# Patient Record
Sex: Female | Born: 1985 | Hispanic: Yes | Marital: Single | Smoking: Never smoker
Health system: Southern US, Community
[De-identification: ages and names within clinical notes are randomized; demographics above are authoritative.]

## PROBLEM LIST (undated history)

## (undated) ENCOUNTER — Inpatient Hospital Stay (HOSPITAL_COMMUNITY): Payer: Self-pay

## (undated) DIAGNOSIS — N390 Urinary tract infection, site not specified: Secondary | ICD-10-CM

## (undated) DIAGNOSIS — N83209 Unspecified ovarian cyst, unspecified side: Secondary | ICD-10-CM

## (undated) DIAGNOSIS — L309 Dermatitis, unspecified: Secondary | ICD-10-CM

## (undated) DIAGNOSIS — O24419 Gestational diabetes mellitus in pregnancy, unspecified control: Secondary | ICD-10-CM

## (undated) DIAGNOSIS — R519 Headache, unspecified: Secondary | ICD-10-CM

## (undated) DIAGNOSIS — Z789 Other specified health status: Secondary | ICD-10-CM

## (undated) HISTORY — DX: Headache, unspecified: R51.9

## (undated) HISTORY — PX: TONSILLECTOMY: SUR1361

## (undated) HISTORY — PX: WISDOM TOOTH EXTRACTION: SHX21

---
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
## 2008-03-20 ENCOUNTER — Ambulatory Visit (HOSPITAL_COMMUNITY): Admission: RE | Admit: 2008-03-20 | Discharge: 2008-03-20 | Payer: Self-pay | Admitting: Family Medicine

## 2008-04-10 ENCOUNTER — Ambulatory Visit (HOSPITAL_COMMUNITY): Admission: RE | Admit: 2008-04-10 | Discharge: 2008-04-10 | Payer: Self-pay | Admitting: Family Medicine

## 2008-04-24 ENCOUNTER — Ambulatory Visit (HOSPITAL_COMMUNITY): Admission: RE | Admit: 2008-04-24 | Discharge: 2008-04-24 | Payer: Self-pay | Admitting: Obstetrics & Gynecology

## 2008-07-01 ENCOUNTER — Ambulatory Visit (HOSPITAL_COMMUNITY): Admission: RE | Admit: 2008-07-01 | Discharge: 2008-07-01 | Payer: Self-pay | Admitting: Obstetrics & Gynecology

## 2008-09-27 ENCOUNTER — Ambulatory Visit: Payer: Self-pay | Admitting: Advanced Practice Midwife

## 2008-09-27 ENCOUNTER — Inpatient Hospital Stay (HOSPITAL_COMMUNITY): Admission: AD | Admit: 2008-09-27 | Discharge: 2008-09-30 | Payer: Self-pay | Admitting: Obstetrics & Gynecology

## 2009-05-15 ENCOUNTER — Emergency Department (HOSPITAL_COMMUNITY): Admission: EM | Admit: 2009-05-15 | Discharge: 2009-05-15 | Payer: Self-pay | Admitting: Emergency Medicine

## 2009-12-20 ENCOUNTER — Inpatient Hospital Stay (HOSPITAL_COMMUNITY): Admission: AD | Admit: 2009-12-20 | Discharge: 2009-12-20 | Payer: Self-pay | Admitting: Obstetrics & Gynecology

## 2010-01-16 ENCOUNTER — Ambulatory Visit (HOSPITAL_COMMUNITY): Admission: RE | Admit: 2010-01-16 | Discharge: 2010-01-16 | Payer: Self-pay | Admitting: Family Medicine

## 2010-01-23 ENCOUNTER — Ambulatory Visit: Payer: Self-pay | Admitting: Obstetrics & Gynecology

## 2010-07-23 LAB — POCT PREGNANCY, URINE: Preg Test, Ur: NEGATIVE

## 2010-07-24 LAB — URINALYSIS, ROUTINE W REFLEX MICROSCOPIC
Bilirubin Urine: NEGATIVE
Protein, ur: NEGATIVE mg/dL
Specific Gravity, Urine: >1.03 — ABNORMAL HIGH (ref 1.005–1.030)
pH: 5.5 (ref 5.0–8.0)

## 2010-07-24 LAB — POCT PREGNANCY, URINE: Preg Test, Ur: NEGATIVE

## 2010-07-26 LAB — URINE MICROSCOPIC-ADD ON

## 2010-07-26 LAB — URINALYSIS, ROUTINE W REFLEX MICROSCOPIC
Bilirubin Urine: NEGATIVE
Nitrite: NEGATIVE
Specific Gravity, Urine: 1.017 (ref 1.005–1.030)
pH: 6.5 (ref 5.0–8.0)

## 2010-07-26 LAB — WET PREP, GENITAL: Trich, Wet Prep: NONE SEEN

## 2010-07-26 LAB — GC/CHLAMYDIA PROBE AMP, GENITAL: GC Probe Amp, Genital: NEGATIVE

## 2010-08-18 LAB — CBC
HCT: 30.2 % — ABNORMAL LOW (ref 36.0–46.0)
HCT: 37.3 % (ref 36.0–46.0)
Hemoglobin: 10.5 g/dL — ABNORMAL LOW (ref 12.0–15.0)
Hemoglobin: 13 g/dL (ref 12.0–15.0)
MCHC: 34.9 g/dL (ref 30.0–36.0)
MCV: 86.9 fL (ref 78.0–100.0)
MCV: 87.9 fL (ref 78.0–100.0)
RBC: 3.43 MIL/uL — ABNORMAL LOW (ref 3.87–5.11)
RBC: 4.29 MIL/uL (ref 3.87–5.11)
WBC: 9.9 10*3/uL (ref 4.0–10.5)

## 2015-05-11 NOTE — L&D Delivery Note (Signed)
Patient is 30 y.o. G2P1001 8556w5d admitted for SOL.   Delivery Note At 9:58 AM a viable female was delivered via Vaginal, Spontaneous Delivery (Presentation: ROA ).  APGAR: 8, 9; weight pending .   Placenta status: intact .  Cord: 3 vessel. Without complications  Anesthesia:  none Episiotomy: None Lacerations: None Suture Repair: none Est. Blood Loss (mL): 100  Mom to postpartum.  Baby to Couplet care / Skin to Skin.  Leland HerElsia J Yoo 01/23/2016, 10:14 AM  Patient is a G2P1001 at 9256w5d who was admitted in spont active labor, uncomplicated prenatal course.  She progressed without augmentation via to SVD.  I was gloved and present for delivery in its entirety.  Second stage of labor progressed, baby delivered after approx 20 mins of pushing.  Mild decels during second stage noted.  Complications: infant w/ slight turtling; pt turned to right lat Sims and post shoulder released without difficulty  Lacerations: none  EBL: 100cc  Victory Dresden, CNM 1:00 PM  01/23/2016         Upon arrival patient was complete and pushing. She pushed with good maternal effort to deliver a healthy baby girl. Baby delivered without difficulty, was noted to have good tone and place on maternal abdomen for oral suctioning, drying and stimulation. Delayed cord clamping performed. Placenta delivered intact with 3V cord. Vaginal canal and perineum was inspected and no repaired deemed necessary; hemostatic. Pitocin was started and uterus massaged until bleeding slowed. Counts of sharps, instruments, and lap pads were all correct.   Leland HerElsia J Yoo, DO PGY-1 9/15/201710:14 AM

## 2015-07-10 ENCOUNTER — Telehealth: Payer: Self-pay | Admitting: *Deleted

## 2015-07-10 ENCOUNTER — Other Ambulatory Visit: Payer: Self-pay | Admitting: Obstetrics

## 2015-07-10 DIAGNOSIS — J111 Influenza due to unidentified influenza virus with other respiratory manifestations: Secondary | ICD-10-CM

## 2015-07-10 MED ORDER — OSELTAMIVIR PHOSPHATE 75 MG PO CAPS: 75.0000 mg | ORAL_CAPSULE | Freq: Two times a day (BID) | ORAL | Status: DC

## 2015-07-10 NOTE — Telephone Encounter (Signed)
Patient is sick with flu symptoms since Monday. She is [redacted] weeks pregnant. Is there anything she can do? Advised to push fluids, get rest, and will speak to provider to see if she is a candidate for Tamiflu.

## 2015-07-10 NOTE — Telephone Encounter (Signed)
Tamiflu Rx 

## 2015-07-11 ENCOUNTER — Encounter (HOSPITAL_COMMUNITY): Payer: Self-pay | Admitting: *Deleted

## 2015-07-11 ENCOUNTER — Inpatient Hospital Stay (HOSPITAL_COMMUNITY)
Admission: AD | Admit: 2015-07-11 | Discharge: 2015-07-11 | Disposition: A | Discharge status: Home / Self Care | Payer: Medicaid Other | Source: Ambulatory Visit | Attending: Obstetrics and Gynecology | Admitting: Obstetrics and Gynecology

## 2015-07-11 DIAGNOSIS — Z3A17 17 weeks gestation of pregnancy: Secondary | ICD-10-CM | POA: Diagnosis not present

## 2015-07-11 DIAGNOSIS — J069 Acute upper respiratory infection, unspecified: Secondary | ICD-10-CM | POA: Diagnosis not present

## 2015-07-11 DIAGNOSIS — O26892 Other specified pregnancy related conditions, second trimester: Secondary | ICD-10-CM | POA: Diagnosis not present

## 2015-07-11 DIAGNOSIS — R112 Nausea with vomiting, unspecified: Secondary | ICD-10-CM | POA: Diagnosis present

## 2015-07-11 DIAGNOSIS — R05 Cough: Secondary | ICD-10-CM | POA: Diagnosis present

## 2015-07-11 DIAGNOSIS — O9989 Other specified diseases and conditions complicating pregnancy, childbirth and the puerperium: Secondary | ICD-10-CM

## 2015-07-11 DIAGNOSIS — R509 Fever, unspecified: Secondary | ICD-10-CM | POA: Diagnosis present

## 2015-07-11 HISTORY — DX: Other specified health status: Z78.9

## 2015-07-11 LAB — URINALYSIS, ROUTINE W REFLEX MICROSCOPIC
Bilirubin Urine: NEGATIVE
Glucose, UA: NEGATIVE mg/dL
Hgb urine dipstick: NEGATIVE
Ketones, ur: NEGATIVE mg/dL
LEUKOCYTES UA: NEGATIVE
NITRITE: NEGATIVE
PH: 6 (ref 5.0–8.0)
Protein, ur: NEGATIVE mg/dL

## 2015-07-11 MED ORDER — PROMETHAZINE HCL 25 MG PO TABS: 25.0000 mg | ORAL_TABLET | Freq: Four times a day (QID) | ORAL | Status: DC | PRN

## 2015-07-11 MED ORDER — BENZONATATE 100 MG PO CAPS: 200.0000 mg | ORAL_CAPSULE | Freq: Three times a day (TID) | ORAL | Status: DC | PRN

## 2015-07-11 NOTE — Progress Notes (Signed)
Written and verbal d/c instructions given by Caprice Renshawebra Callaway RN and pt voiced understanding.

## 2015-07-11 NOTE — Discharge Instructions (Signed)
Upper Respiratory Infection, Adult Most upper respiratory infections (URIs) are a viral infection of the air passages leading to the lungs. A URI affects the nose, throat, and upper air passages. The most common type of URI is nasopharyngitis and is typically referred to as "the common cold." URIs run their course and usually go away on their own. Most of the time, a URI does not require medical attention, but sometimes a bacterial infection in the upper airways can follow a viral infection. This is called a secondary infection. Sinus and middle ear infections are common types of secondary upper respiratory infections. Bacterial pneumonia can also complicate a URI. A URI can worsen asthma and chronic obstructive pulmonary disease (COPD). Sometimes, these complications can require emergency medical care and may be life threatening.  CAUSES Almost all URIs are caused by viruses. A virus is a type of germ and can spread from one person to another.  RISKS FACTORS You may be at risk for a URI if:   You smoke.   You have chronic heart or lung disease.  You have a weakened defense (immune) system.   You are very young or very old.   You have nasal allergies or asthma.  You work in crowded or poorly ventilated areas.  You work in health care facilities or schools. SIGNS AND SYMPTOMS  Symptoms typically develop 2-3 days after you come in contact with a cold virus. Most viral URIs last 7-10 days. However, viral URIs from the influenza virus (flu virus) can last 14-18 days and are typically more severe. Symptoms may include:   Runny or stuffy (congested) nose.   Sneezing.   Cough.   Sore throat.   Headache.   Fatigue.   Fever.   Loss of appetite.   Pain in your forehead, behind your eyes, and over your cheekbones (sinus pain).  Muscle aches.  DIAGNOSIS  Your health care provider may diagnose a URI by:  Physical exam.  Tests to check that your symptoms are not due to  another condition such as:  Strep throat.  Sinusitis.  Pneumonia.  Asthma. TREATMENT  A URI goes away on its own with time. It cannot be cured with medicines, but medicines may be prescribed or recommended to relieve symptoms. Medicines may help:  Reduce your fever.  Reduce your cough.  Relieve nasal congestion. HOME CARE INSTRUCTIONS   Take medicines only as directed by your health care provider.   Gargle warm saltwater or take cough drops to comfort your throat as directed by your health care provider.  Use a warm mist humidifier or inhale steam from a shower to increase air moisture. This may make it easier to breathe.  Drink enough fluid to keep your urine clear or pale yellow.   Eat soups and other clear broths and maintain good nutrition.   Rest as needed.   Return to work when your temperature has returned to normal or as your health care provider advises. You may need to stay home longer to avoid infecting others. You can also use a face mask and careful hand washing to prevent spread of the virus.  Increase the usage of your inhaler if you have asthma.   Do not use any tobacco products, including cigarettes, chewing tobacco, or electronic cigarettes. If you need help quitting, ask your health care provider. PREVENTION  The best way to protect yourself from getting a cold is to practice good hygiene.   Avoid oral or hand contact with people with cold   symptoms.   Wash your hands often if contact occurs.  There is no clear evidence that vitamin C, vitamin E, echinacea, or exercise reduces the chance of developing a cold. However, it is always recommended to get plenty of rest, exercise, and practice good nutrition.  SEEK MEDICAL CARE IF:   You are getting worse rather than better.   Your symptoms are not controlled by medicine.   You have chills.  You have worsening shortness of breath.  You have brown or red mucus.  You have yellow or brown nasal  discharge.  You have pain in your face, especially when you bend forward.  You have a fever.  You have swollen neck glands.  You have pain while swallowing.  You have white areas in the back of your throat. SEEK IMMEDIATE MEDICAL CARE IF:   You have severe or persistent:  Headache.  Ear pain.  Sinus pain.  Chest pain.  You have chronic lung disease and any of the following:  Wheezing.  Prolonged cough.  Coughing up blood.  A change in your usual mucus.  You have a stiff neck.  You have changes in your:  Vision.  Hearing.  Thinking.  Mood. MAKE SURE YOU:   Understand these instructions.  Will watch your condition.  Will get help right away if you are not doing well or get worse.   This information is not intended to replace advice given to you by your health care provider. Make sure you discuss any questions you have with your health care provider.   Document Released: 10/20/2000 Document Revised: 09/10/2014 Document Reviewed: 08/01/2013 Elsevier Interactive Patient Education 2016 [employer]  

## 2015-07-11 NOTE — MAU Note (Signed)
Having flu symptoms since Monday. DR Clearance CootsHarper gave me Tamaflu yesterday. Seem to be getting worse. Have fevers, cough which causes my chest and back to hurt. Vomiting at least 6 times a day. No diarrhea. No vag bleeding or d/c.

## 2015-07-11 NOTE — MAU Provider Note (Signed)
  History     CSN: [id number]  Arrival date and time: 07/11/15 [id number]   First Provider Initiated Contact with Patient 07/11/15 2014      Chief Complaint  Patient presents with  . Cough  . Fever   HPI  Debra Wyatt 30 y.o. G2P1001 @[redacted]w[redacted]d  presents to the MAU because she has a cough, running fevers on and off and nausea and vomiting. Son has been diagnosed with the flu. Pt has been taking Tamiflu.  Past Medical History  Diagnosis Date  . Medical history non-contributory     Past Surgical History  Procedure Laterality Date  . Tonsillectomy      Family History  Problem Relation Age of Onset  . Cancer Maternal Grandfather   . Diabetes Paternal Grandmother   . Diabetes Paternal Grandfather     Social History  Substance Use Topics  . Smoking status: Never Smoker   . Smokeless tobacco: None  . Alcohol Use: No    Allergies: No Known Allergies  Prescriptions prior to admission  Medication Sig Dispense Refill Last Dose  . oseltamivir (TAMIFLU) 75 MG capsule Take 1 capsule (75 mg total) by mouth 2 (two) times daily. 10 capsule 0 07/11/2015 at Unknown time  . Prenatal Vit-Fe Fumarate-FA (PRENATAL MULTIVITAMIN) TABS tablet Take 1 tablet by mouth daily at 12 noon.   07/11/2015 at Unknown time    Review of Systems  Constitutional: Negative for fever.  HENT: Positive for congestion and ear pain.   Respiratory: Positive for cough and sputum production.   Gastrointestinal: Positive for nausea and vomiting.  All other systems reviewed and are negative.  Physical Exam   Blood pressure 128/74, pulse 108, temperature 98.2 F (36.8 C), resp. rate 18, height 5\' 1"  (1.549 m), weight 206 lb 3.2 oz (93.532 kg), last menstrual period 03/13/2015, SpO2 99 %.  Physical Exam  Nursing note and vitals reviewed. Constitutional: She is oriented to person, place, and time. She appears well-developed and well-nourished. No distress.  HENT:  Head: Normocephalic and atraumatic.  Right Ear:  External ear normal.  Left Ear: External ear normal.  Neck: Normal range of motion.  Cardiovascular: Normal rate.   Respiratory: Effort normal and breath sounds normal. No respiratory distress.  GI: Soft. There is no tenderness.  Musculoskeletal: Normal range of motion.  Neurological: She is alert and oriented to person, place, and time.  Skin: Skin is warm and dry.  Psychiatric: She has a normal mood and affect. Her behavior is normal. Judgment and thought content normal.    MAU Course  Procedures   Positive heart tones  Assessment and Plan  URI Phenergan 25mg   Tessalon Perls 200mg  Pt is to remain off work until she is fever free for 48 hours Discharge  Braylynn Ghan Grissett 07/11/2015, 8:26 PM

## 2015-07-23 ENCOUNTER — Encounter: Payer: Self-pay | Admitting: Obstetrics

## 2015-07-23 ENCOUNTER — Telehealth: Payer: Self-pay

## 2015-07-23 ENCOUNTER — Ambulatory Visit (INDEPENDENT_AMBULATORY_CARE_PROVIDER_SITE_OTHER): Payer: Medicaid Other | Admitting: Obstetrics

## 2015-07-23 VITALS — BP 119/77 | HR 97 | Temp 98.4°F | Wt 206.0 lb

## 2015-07-23 DIAGNOSIS — K219 Gastro-esophageal reflux disease without esophagitis: Secondary | ICD-10-CM

## 2015-07-23 DIAGNOSIS — Z3482 Encounter for supervision of other normal pregnancy, second trimester: Secondary | ICD-10-CM | POA: Diagnosis not present

## 2015-07-23 LAB — POCT URINALYSIS DIPSTICK
Bilirubin, UA: NEGATIVE
Blood, UA: NEGATIVE
GLUCOSE UA: NEGATIVE
Nitrite, UA: NEGATIVE
Spec Grav, UA: 1.025
Urobilinogen, UA: NEGATIVE
pH, UA: 5

## 2015-07-23 MED ORDER — RANITIDINE HCL 150 MG PO TABS: 150.0000 mg | ORAL_TABLET | Freq: Two times a day (BID) | ORAL | Status: DC

## 2015-07-23 NOTE — Telephone Encounter (Signed)
NEED ORDER CHANGED TO MFM - DID NOT HAVE ANY APPTS HERE IN THE NEXT 2 WEEKS - HAS APPT AT [facility]

## 2015-07-23 NOTE — Progress Notes (Signed)
Subjective:    Debra Wyatt is being seen today for her first obstetrical visit.  This is not a planned pregnancy. She is at 3533w6d gestation. Her obstetrical history is significant for obesity. Relationship with FOB: significant other, living together. Patient does intend to breast feed. Pregnancy history fully reviewed.  The information documented in the HPI was reviewed and verified.  Menstrual History: OB History    Gravida Para Term Preterm AB TAB SAB Ectopic Multiple Living   2 1 1       1        Patient's last menstrual period was 03/13/2015.    Past Medical History  Diagnosis Date  . Medical history non-contributory     Past Surgical History  Procedure Laterality Date  . Tonsillectomy       (Not in a hospital admission) No Known Allergies  Social History  Substance Use Topics  . Smoking status: Never Smoker   . Smokeless tobacco: Never Used  . Alcohol Use: No    Family History  Problem Relation Age of Onset  . Cancer Maternal Grandfather   . Diabetes Paternal Grandmother   . Diabetes Paternal Grandfather   . Hypertension Father      Review of Systems Constitutional: negative for weight loss Gastrointestinal: negative for vomiting Genitourinary:negative for genital lesions and vaginal discharge and dysuria Musculoskeletal:negative for back pain Behavioral/Psych: negative for abusive relationship, depression, illegal drug usage and tobacco use    Objective:    BP 119/77 mmHg  Pulse 97  Temp(Src) 98.4 F (36.9 C)  Wt 206 lb (93.441 kg)  LMP 03/13/2015 General Appearance:    Alert, cooperative, no distress, appears stated age  Head:    Normocephalic, without obvious abnormality, atraumatic  Eyes:    PERRL, conjunctiva/corneas clear, EOM's intact, fundi    benign, both eyes  Ears:    Normal TM's and external ear canals, both ears  Nose:   Nares normal, septum midline, mucosa normal, no drainage    or sinus tenderness  Throat:   Lips, mucosa, and  tongue normal; teeth and gums normal  Neck:   Supple, symmetrical, trachea midline, no adenopathy;    thyroid:  no enlargement/tenderness/nodules; no carotid   bruit or JVD  Back:     Symmetric, no curvature, ROM normal, no CVA tenderness  Lungs:     Clear to auscultation bilaterally, respirations unlabored  Chest Wall:    No tenderness or deformity   Heart:    Regular rate and rhythm, S1 and S2 normal, no murmur, rub   or gallop  Breast Exam:    No tenderness, masses, or nipple abnormality  Abdomen:     Soft, non-tender, bowel sounds active all four quadrants,    no masses, no organomegaly  Genitalia:    Normal female without lesion, discharge or tenderness  Extremities:   Extremities normal, atraumatic, no cyanosis or edema  Pulses:   2+ and symmetric all extremities  Skin:   Skin color, texture, turgor normal, no rashes or lesions  Lymph nodes:   Cervical, supraclavicular, and axillary nodes normal  Neurologic:   CNII-XII intact, normal strength, sensation and reflexes    throughout      Lab Review Urine pregnancy test Labs reviewed yes Radiologic studies reviewed no Assessment:    Pregnancy at 5833w6d weeks    Plan:      Prenatal vitamins.  Counseling provided regarding continued use of seat belts, cessation of alcohol consumption, smoking or use of illicit drugs; infection precautions  i.e., influenza/TDAP immunizations, toxoplasmosis,CMV, parvovirus, listeria and varicella; workplace safety, exercise during pregnancy; routine dental care, safe medications, sexual activity, hot tubs, saunas, pools, travel, caffeine use, fish and methlymercury, potential toxins, hair treatments, varicose veins Weight gain recommendations per IOM guidelines reviewed: underweight/BMI< 18.5--> gain 28 - 40 lbs; normal weight/BMI 18.5 - 24.9--> gain 25 - 35 lbs; overweight/BMI 25 - 29.9--> gain 15 - 25 lbs; obese/BMI >30->gain  11 - 20 lbs Problem list reviewed and updated. FIRST/CF mutation  testing/NIPT/QUAD SCREEN/fragile X/Ashkenazi Jewish population testing/Spinal muscular atrophy discussed: requested. Role of ultrasound in pregnancy discussed; fetal survey: requested. Amniocentesis discussed: not indicated. VBAC calculator score: VBAC consent form provided Meds ordered this encounter  Medications  . ranitidine (ZANTAC) 150 MG tablet    Sig: Take 1 tablet (150 mg total) by mouth 2 (two) times daily.    Dispense:  60 tablet    Refill:  6   Orders Placed This Encounter  Procedures  . Culture, OB Urine  . US OB Comp + 14 Wk    Standing Status: Future     Number of Occurrences:      Standing Expiration Date: 09/21/2016    Order Specific Question:  Reason for Exam (SYMPTOM  OR DIAGNOSIS REQUIRED)    Answer:  Anatomy    Order Specific Question:  Preferred imaging location?    Answer:  Internal  . HIV antibody  . Hemoglobinopathy evaluation  . Varicella zoster antibody, IgG  . VITAMIN D 25 Hydroxy (Vit-D Deficiency, Fractures)  . Prenatal Profile I  . NuSwab Vaginitis Plus (VG+)  . POCT urinalysis dipstick    Follow up in 4 weeks.

## 2015-07-24 ENCOUNTER — Other Ambulatory Visit: Payer: Self-pay | Admitting: Obstetrics

## 2015-07-24 DIAGNOSIS — Z3689 Encounter for other specified antenatal screening: Secondary | ICD-10-CM

## 2015-07-24 NOTE — Telephone Encounter (Signed)
Please fix order

## 2015-07-25 LAB — AFP, QUAD SCREEN
DIA VALUE (EIA): 164.76 pg/mL
DSR (BY AGE) 1 IN: 871
MSAFP: 10.5 ng/mL
MSHCG: 50008 m[IU]/mL
Maternal Age At EDD: 27.8 YEARS
PDF: 0
uE3 Value: 0.24 ng/mL

## 2015-07-25 LAB — CULTURE, OB URINE

## 2015-07-25 LAB — URINE CULTURE, OB REFLEX

## 2015-07-26 LAB — NUSWAB VAGINITIS PLUS (VG+)
CHLAMYDIA TRACHOMATIS, NAA: NEGATIVE
Candida albicans, NAA: NEGATIVE
Candida glabrata, NAA: NEGATIVE
NEISSERIA GONORRHOEAE, NAA: NEGATIVE
Trich vag by NAA: NEGATIVE

## 2015-07-30 ENCOUNTER — Ambulatory Visit (HOSPITAL_COMMUNITY)
Admission: RE | Admit: 2015-07-30 | Discharge: 2015-07-30 | Disposition: A | Discharge status: Home / Self Care | Payer: Medicaid Other | Source: Ambulatory Visit | Attending: Obstetrics | Admitting: Obstetrics

## 2015-07-30 ENCOUNTER — Encounter (HOSPITAL_COMMUNITY): Payer: Self-pay

## 2015-07-30 ENCOUNTER — Other Ambulatory Visit: Payer: Self-pay | Admitting: Obstetrics

## 2015-07-30 DIAGNOSIS — Z0489 Encounter for examination and observation for other specified reasons: Secondary | ICD-10-CM

## 2015-07-30 DIAGNOSIS — Z3689 Encounter for other specified antenatal screening: Secondary | ICD-10-CM

## 2015-07-30 DIAGNOSIS — Z36 Encounter for antenatal screening of mother: Secondary | ICD-10-CM | POA: Diagnosis not present

## 2015-07-30 DIAGNOSIS — Z3A14 14 weeks gestation of pregnancy: Secondary | ICD-10-CM | POA: Diagnosis not present

## 2015-07-30 DIAGNOSIS — IMO0002 Reserved for concepts with insufficient information to code with codable children: Secondary | ICD-10-CM

## 2015-07-30 LAB — HEMOGLOBINOPATHY EVALUATION
HEMOGLOBIN F QUANTITATION: 0 % (ref 0.0–2.0)
HGB C: 0 %
HGB S: 0 %
Hemoglobin A2 Quantitation: 2.6 % (ref 0.7–3.1)
Hgb A: 97.4 % (ref 94.0–98.0)

## 2015-07-30 LAB — PRENATAL PROFILE I(LABCORP)
Antibody Screen: NEGATIVE
BASOS: 0 %
Basophils Absolute: 0 10*3/uL (ref 0.0–0.2)
EOS (ABSOLUTE): 0.1 10*3/uL (ref 0.0–0.4)
EOS: 1 %
HEMATOCRIT: 37.4 % (ref 34.0–46.6)
HEP B S AG: NEGATIVE
Hemoglobin: 12.8 g/dL (ref 11.1–15.9)
IMMATURE GRANS (ABS): 0 10*3/uL (ref 0.0–0.1)
Immature Granulocytes: 0 %
LYMPHS: 22 %
Lymphocytes Absolute: 2.1 10*3/uL (ref 0.7–3.1)
MCH: 28.2 pg (ref 26.6–33.0)
MCHC: 34.2 g/dL (ref 31.5–35.7)
MCV: 82 fL (ref 79–97)
MONOCYTES: 4 %
Monocytes Absolute: 0.4 10*3/uL (ref 0.1–0.9)
NEUTROS ABS: 6.9 10*3/uL (ref 1.4–7.0)
Neutrophils: 73 %
Platelets: 263 10*3/uL (ref 150–379)
RBC: 4.54 x10E6/uL (ref 3.77–5.28)
RDW: 13.5 % (ref 12.3–15.4)
RH TYPE: POSITIVE
RPR: NONREACTIVE
Rubella Antibodies, IGG: <0.9 index — ABNORMAL LOW (ref >0.99)
WBC: 9.5 10*3/uL (ref 3.4–10.8)

## 2015-07-30 LAB — VARICELLA ZOSTER ANTIBODY, IGG

## 2015-07-30 LAB — VITAMIN D 25 HYDROXY (VIT D DEFICIENCY, FRACTURES): VIT D 25 HYDROXY: 16.9 ng/mL — AB (ref 30.0–100.0)

## 2015-07-30 LAB — HIV ANTIBODY (ROUTINE TESTING W REFLEX): HIV SCREEN 4TH GENERATION: NONREACTIVE

## 2015-08-20 ENCOUNTER — Ambulatory Visit (INDEPENDENT_AMBULATORY_CARE_PROVIDER_SITE_OTHER): Payer: Medicaid Other | Admitting: Obstetrics

## 2015-08-20 VITALS — BP 122/83 | HR 87 | Temp 98.2°F | Wt 205.0 lb

## 2015-08-20 DIAGNOSIS — Z3482 Encounter for supervision of other normal pregnancy, second trimester: Secondary | ICD-10-CM

## 2015-08-20 LAB — POCT URINALYSIS DIPSTICK
BILIRUBIN UA: NEGATIVE
GLUCOSE UA: NEGATIVE
Leukocytes, UA: NEGATIVE
NITRITE UA: NEGATIVE
PH UA: 6
Protein, UA: NEGATIVE
RBC UA: NEGATIVE
SPEC GRAV UA: 1.02
UROBILINOGEN UA: NEGATIVE

## 2015-08-20 MED ORDER — VITAFOL ULTRA 29-0.6-0.4-200 MG PO CAPS: 1.0000 | ORAL_CAPSULE | Freq: Every day | ORAL | Status: DC

## 2015-08-20 NOTE — Progress Notes (Signed)
Pt states that she had some mild cramping last week. Denies bleeding.

## 2015-08-21 ENCOUNTER — Encounter: Payer: Self-pay | Admitting: Obstetrics

## 2015-08-21 NOTE — Progress Notes (Signed)
  Subjective:    Debra Wyatt is a 30 y.o. female being seen today for her obstetrical visit. She is at 441w4d gestation. Patient reports: no complaints.  Problem List Items Addressed This Visit    None    Visit Diagnoses    Encounter for supervision of other normal pregnancy in second trimester    -  Primary    Relevant Medications    Prenat-Fe Poly-Methfol-FA-DHA (VITAFOL ULTRA) 29-0.6-0.4-200 MG CAPS    Other Relevant Orders    POCT urinalysis dipstick (Completed)      There are no active problems to display for this patient.   Objective:     BP 122/83 mmHg  Pulse 87  Temp(Src) 98.2 F (36.8 C)  Wt 205 lb (92.987 kg)  LMP 03/13/2015 Uterine Size: Below umbilicus     Assessment:    Pregnancy @ 5741w4d  weeks Doing well    Plan:    Problem list reviewed and updated. Labs reviewed.  Follow up in 4 weeks. FIRST/CF mutation testing/NIPT/QUAD SCREEN/fragile X/Ashkenazi Jewish population testing/Spinal muscular atrophy discussed: requested. Role of ultrasound in pregnancy discussed; fetal survey: requested. Amniocentesis discussed: not indicated.

## 2015-08-26 ENCOUNTER — Other Ambulatory Visit: Payer: Self-pay | Admitting: Certified Nurse Midwife

## 2015-09-03 ENCOUNTER — Ambulatory Visit (HOSPITAL_COMMUNITY)
Admission: RE | Admit: 2015-09-03 | Discharge: 2015-09-03 | Disposition: A | Discharge status: Home / Self Care | Payer: Medicaid Other | Source: Ambulatory Visit | Attending: Obstetrics | Admitting: Obstetrics

## 2015-09-03 DIAGNOSIS — Z3A19 19 weeks gestation of pregnancy: Secondary | ICD-10-CM | POA: Diagnosis not present

## 2015-09-03 DIAGNOSIS — Z36 Encounter for antenatal screening of mother: Secondary | ICD-10-CM | POA: Insufficient documentation

## 2015-09-03 DIAGNOSIS — IMO0002 Reserved for concepts with insufficient information to code with codable children: Secondary | ICD-10-CM

## 2015-09-03 DIAGNOSIS — Z0489 Encounter for examination and observation for other specified reasons: Secondary | ICD-10-CM

## 2015-09-17 ENCOUNTER — Ambulatory Visit (INDEPENDENT_AMBULATORY_CARE_PROVIDER_SITE_OTHER): Payer: Medicaid Other | Admitting: Obstetrics

## 2015-09-17 ENCOUNTER — Encounter: Payer: Self-pay | Admitting: Obstetrics

## 2015-09-17 VITALS — BP 122/75 | HR 98 | Temp 98.7°F | Wt 208.0 lb

## 2015-09-17 DIAGNOSIS — Z3492 Encounter for supervision of normal pregnancy, unspecified, second trimester: Secondary | ICD-10-CM

## 2015-09-17 LAB — POCT URINALYSIS DIPSTICK
Bilirubin, UA: NEGATIVE
Glucose, UA: NEGATIVE
Leukocytes, UA: NEGATIVE
NITRITE UA: NEGATIVE
PH UA: 6
Protein, UA: NEGATIVE
RBC UA: NEGATIVE
Spec Grav, UA: 1.015
UROBILINOGEN UA: NEGATIVE

## 2015-09-17 NOTE — Progress Notes (Signed)
Pt states she does not feel baby move at all during the day and only a little at night time

## 2015-09-17 NOTE — Progress Notes (Signed)
Subjective:    Debra Wyatt is a 30 y.o. female being seen today for her obstetrical visit. She is at 5620w3d gestation. Patient reports: no complaints . Fetal movement: normal.  Problem List Items Addressed This Visit    None    Visit Diagnoses    Prenatal care in second trimester    -  Primary    Relevant Orders    POCT Urinalysis Dipstick (Completed)      There are no active problems to display for this patient.  Objective:    BP 122/75 mmHg  Pulse 98  Temp(Src) 98.7 F (37.1 C)  Wt 208 lb (94.348 kg)  LMP 03/13/2015 FHT: 150 BPM  Uterine Size: size equals dates     Assessment:    Pregnancy @ 6720w3d    Plan:    Signs and symptoms of preterm labor: discussed.  Labs, problem list reviewed and updated 2 hr GTT planned Follow up in 4 weeks.

## 2015-10-15 ENCOUNTER — Encounter: Payer: Medicaid Other | Admitting: Obstetrics

## 2015-10-27 ENCOUNTER — Ambulatory Visit (INDEPENDENT_AMBULATORY_CARE_PROVIDER_SITE_OTHER): Payer: Medicaid Other | Admitting: Obstetrics

## 2015-10-27 ENCOUNTER — Encounter: Payer: Self-pay | Admitting: Obstetrics

## 2015-10-27 VITALS — BP 127/73 | HR 96 | Temp 98.6°F | Wt 210.0 lb

## 2015-10-27 DIAGNOSIS — Z3493 Encounter for supervision of normal pregnancy, unspecified, third trimester: Secondary | ICD-10-CM

## 2015-10-27 LAB — POCT URINALYSIS DIPSTICK
BILIRUBIN UA: NEGATIVE
Blood, UA: NEGATIVE
Glucose, UA: NEGATIVE
Ketones, UA: NEGATIVE
LEUKOCYTES UA: NEGATIVE
NITRITE UA: NEGATIVE
PH UA: 5
PROTEIN UA: NEGATIVE
Spec Grav, UA: 1.025
Urobilinogen, UA: NEGATIVE

## 2015-10-27 NOTE — Progress Notes (Signed)
Subjective:    Debra Wyatt is a 30 y.o. female being seen today for her obstetrical visit. She is at 4763w1d gestation. Patient reports: backache and occasional contractions . Fetal movement: normal.  Problem List Items Addressed This Visit    None    Visit Diagnoses    Prenatal care, second trimester    -  Primary    Relevant Orders    POCT urinalysis dipstick (Completed)      There are no active problems to display for this patient.  Objective:    BP 127/73 mmHg  Pulse 96  Temp(Src) 98.6 F (37 C)  Wt 210 lb (95.255 kg)  LMP 03/13/2015 FHT: 140 BPM  Uterine Size: size equals dates     Assessment:    Pregnancy @ 3163w1d    Plan:    OBGCT: ordered for next visit. Signs and symptoms of preterm labor: discussed.  Labs, problem list reviewed and updated 2 hr GTT planned Follow up in 2 weeks.

## 2015-11-03 ENCOUNTER — Encounter: Payer: Self-pay | Admitting: Obstetrics

## 2015-11-03 ENCOUNTER — Ambulatory Visit: Payer: Medicaid Other | Admitting: Obstetrics

## 2015-11-03 ENCOUNTER — Other Ambulatory Visit (INDEPENDENT_AMBULATORY_CARE_PROVIDER_SITE_OTHER): Payer: Medicaid Other

## 2015-11-03 VITALS — BP 125/79 | HR 88 | Temp 98.4°F | Wt 215.0 lb

## 2015-11-03 DIAGNOSIS — Z3493 Encounter for supervision of normal pregnancy, unspecified, third trimester: Secondary | ICD-10-CM

## 2015-11-03 LAB — POCT URINALYSIS DIPSTICK
BILIRUBIN UA: NEGATIVE
GLUCOSE UA: NEGATIVE
KETONES UA: NEGATIVE
Leukocytes, UA: NEGATIVE
NITRITE UA: NEGATIVE
Protein, UA: NEGATIVE
RBC UA: NEGATIVE
Spec Grav, UA: 1.015
Urobilinogen, UA: NEGATIVE
pH, UA: 6

## 2015-11-03 NOTE — Progress Notes (Signed)
Subjective:    Debra Wyatt is a 30 y.o. female being seen today for her obstetrical visit. She is at 4126w1d gestation. Patient reports no complaints. Fetal movement: normal.  Problem List Items Addressed This Visit    None    Visit Diagnoses    Prenatal care, third trimester    -  Primary    Relevant Orders    Glucose Tolerance, 2 Hours w/1 Hour    HIV antibody    RPR    CBC      There are no active problems to display for this patient.  Objective:    LMP 03/13/2015 FHT:  140 BPM  Uterine Size: size equals dates  Presentation: unsure     Assessment:    Pregnancy @ 7026w1d weeks   Plan:     labs reviewed, problem list updated Consent signed. GBS sent TDAP offered  Rhogam given for RH negative Pediatrician: discussed. Infant feeding: plans to breastfeed. Maternity leave: discussed. Cigarette smoking: never smoked. Orders Placed This Encounter  Procedures  . Glucose Tolerance, 2 Hours w/1 Hour  . HIV antibody  . RPR  . CBC   No orders of the defined types were placed in this encounter.   Follow up in 2 Weeks.

## 2015-11-04 LAB — RPR: RPR Ser Ql: NONREACTIVE

## 2015-11-04 LAB — CBC
HEMATOCRIT: 34.9 % (ref 34.0–46.6)
Hemoglobin: 11.5 g/dL (ref 11.1–15.9)
MCH: 28 pg (ref 26.6–33.0)
MCHC: 33 g/dL (ref 31.5–35.7)
MCV: 85 fL (ref 79–97)
Platelets: 237 10*3/uL (ref 150–379)
RBC: 4.11 x10E6/uL (ref 3.77–5.28)
RDW: 13.9 % (ref 12.3–15.4)
WBC: 10.4 10*3/uL (ref 3.4–10.8)

## 2015-11-04 LAB — GLUCOSE TOLERANCE, 2 HOURS W/ 1HR
GLUCOSE, 1 HOUR: 131 mg/dL (ref 65–179)
GLUCOSE, 2 HOUR: 106 mg/dL (ref 65–152)
GLUCOSE, FASTING: 90 mg/dL (ref 65–91)

## 2015-11-04 LAB — HIV ANTIBODY (ROUTINE TESTING W REFLEX): HIV Screen 4th Generation wRfx: NONREACTIVE

## 2015-11-17 ENCOUNTER — Ambulatory Visit (INDEPENDENT_AMBULATORY_CARE_PROVIDER_SITE_OTHER): Payer: Medicaid Other | Admitting: Obstetrics

## 2015-11-17 VITALS — BP 115/73 | HR 98 | Temp 97.8°F | Wt 210.0 lb

## 2015-11-17 DIAGNOSIS — Z3493 Encounter for supervision of normal pregnancy, unspecified, third trimester: Secondary | ICD-10-CM

## 2015-11-17 LAB — POCT URINALYSIS DIPSTICK
Bilirubin, UA: NEGATIVE
GLUCOSE UA: NEGATIVE
KETONES UA: NEGATIVE
Nitrite, UA: NEGATIVE
Protein, UA: NEGATIVE
RBC UA: NEGATIVE
UROBILINOGEN UA: 0.2
pH, UA: 8

## 2015-11-17 NOTE — Progress Notes (Signed)
Pt c/opelvic pressure

## 2015-11-19 ENCOUNTER — Encounter: Payer: Self-pay | Admitting: Obstetrics

## 2015-11-19 NOTE — Progress Notes (Signed)
Subjective:    Debra Wyatt is a 30 y.o. female being seen today for her obstetrical visit. She is at 8774w3d gestation. Patient reports no complaints. Fetal movement: normal.  Problem List Items Addressed This Visit    None    Visit Diagnoses    Prenatal care, third trimester    -  Primary    Relevant Orders    POCT Urinalysis Dipstick (Completed)      There are no active problems to display for this patient.  Objective:    BP 115/73 mmHg  Pulse 98  Temp(Src) 97.8 F (36.6 C)  Wt 210 lb (95.255 kg)  LMP 03/13/2015 FHT:  150 BPM  Uterine Size: size equals dates  Presentation: unsure     Assessment:    Pregnancy @ 5374w3d weeks   Plan:     labs reviewed, problem list updated Consent signed. GBS sent TDAP offered  Rhogam given for RH negative Pediatrician: discussed. Infant feeding: plans to breastfeed. Maternity leave: discussed. Cigarette smoking: never smoked. Orders Placed This Encounter  Procedures  . POCT Urinalysis Dipstick   No orders of the defined types were placed in this encounter.   Follow up in 2 Weeks.

## 2015-12-01 ENCOUNTER — Ambulatory Visit (INDEPENDENT_AMBULATORY_CARE_PROVIDER_SITE_OTHER): Payer: Medicaid Other | Admitting: Obstetrics

## 2015-12-01 VITALS — BP 117/78 | HR 102 | Temp 98.5°F | Wt 213.0 lb

## 2015-12-01 DIAGNOSIS — Z3493 Encounter for supervision of normal pregnancy, unspecified, third trimester: Secondary | ICD-10-CM

## 2015-12-01 LAB — POCT URINALYSIS DIPSTICK
Bilirubin, UA: NEGATIVE
Blood, UA: NEGATIVE
Glucose, UA: NEGATIVE
KETONES UA: NEGATIVE
NITRITE UA: NEGATIVE
PH UA: 6
Spec Grav, UA: 1.02
UROBILINOGEN UA: NEGATIVE

## 2015-12-01 NOTE — Progress Notes (Signed)
Patient reports she is doing well- she is having trouble getting comfortable at night.

## 2015-12-01 NOTE — Progress Notes (Signed)
Subjective:    Debra Wyatt is a 30 y.o. G2P1001 [redacted]w[redacted]d being seen today for her obstetrical visit.  Patient reports no complaints. Fetal movement: normal.  Objective:    BP 117/78   Pulse (!) 102   Temp 98.5 F (36.9 C)   Wt 213 lb (96.6 kg)   LMP 03/13/2015   BMI 40.25 kg/m   Physical Exam  Exam  FHT:  150  Uterine Size:  size=dates  Presentation:  unsure     Assessment:    Pregnancy:  G2P1001.  Doing well.    Plan:    There are no active problems to display for this patient.   Infant feeding: plans to breastfeed. Maternity leave: discussed. Cigarette smoking: never smoked. Follow up in 2 Weeks.

## 2015-12-15 ENCOUNTER — Ambulatory Visit (INDEPENDENT_AMBULATORY_CARE_PROVIDER_SITE_OTHER): Payer: Medicaid Other | Admitting: Obstetrics

## 2015-12-15 VITALS — BP 111/74 | HR 102 | Temp 98.4°F | Wt 214.0 lb

## 2015-12-15 DIAGNOSIS — Z3493 Encounter for supervision of normal pregnancy, unspecified, third trimester: Secondary | ICD-10-CM

## 2015-12-15 LAB — POCT URINALYSIS DIPSTICK
BILIRUBIN UA: NEGATIVE
GLUCOSE UA: NEGATIVE
Ketones, UA: NEGATIVE
NITRITE UA: NEGATIVE
Protein, UA: NEGATIVE
RBC UA: NEGATIVE
Spec Grav, UA: 1.01
Urobilinogen, UA: NEGATIVE
pH, UA: 5

## 2015-12-15 NOTE — Progress Notes (Signed)
Patient reports she is doing well- no changes.Patient ID: Debra Wyatt, female   DOB: [birth year], 30 y.o.   MRN: [id number] Subjective:    Debra Wyatt is a 30 y.o. female being seen today for her obstetrical visit. She is at 3738w1d gestation. Patient reports no complaints. Fetal movement: normal.  Problem List Items Addressed This Visit    None    Visit Diagnoses    Prenatal care, third trimester    -  Primary   Relevant Orders   POCT urinalysis dipstick     There are no active problems to display for this patient.  Objective:    BP 111/74   Pulse (!) 102   Temp 98.4 F (36.9 C)   Wt 214 lb (97.1 kg)   LMP 03/13/2015   BMI 40.43 kg/m  FHT:  140 BPM  Uterine Size: size equals dates  Presentation: unsure     Assessment:    Pregnancy @ 3438w1d weeks   Plan:     labs reviewed, problem list updated Consent signed. GBS sent TDAP offered  Rhogam given for RH negative Pediatrician: discussed. Infant feeding: plans to breastfeed. Maternity leave: discussed. Cigarette smoking:  Orders Placed This Encounter  Procedures  . POCT urinalysis dipstick   No orders of the defined types were placed in this encounter.  Follow up in 1 Week.

## 2015-12-22 ENCOUNTER — Ambulatory Visit (INDEPENDENT_AMBULATORY_CARE_PROVIDER_SITE_OTHER): Payer: Medicaid Other | Admitting: Obstetrics

## 2015-12-22 ENCOUNTER — Encounter: Payer: Self-pay | Admitting: Obstetrics

## 2015-12-22 VITALS — BP 117/78 | HR 108 | Temp 99.1°F | Wt 213.4 lb

## 2015-12-22 DIAGNOSIS — Z3493 Encounter for supervision of normal pregnancy, unspecified, third trimester: Secondary | ICD-10-CM

## 2015-12-22 NOTE — Progress Notes (Signed)
Patient ID: Debra Wyatt, female   DOB: Oct 02, 1985, 30 y.o.   MRN: [id number] Subjective:    Debra Wyatt is a 30 y.o. female being seen today for her obstetrical visit. She is at 7076w1d gestation. Patient reports no complaints. Fetal movement: normal.  Problem List Items Addressed This Visit    None    Visit Diagnoses   None.    There are no active problems to display for this patient.  Objective:    BP 117/78   Pulse (!) 108   Temp 99.1 F (37.3 C)   Wt 213 lb 6 oz (96.8 kg)   LMP 03/13/2015   BMI 40.32 kg/m  FHT:  140 BPM  Uterine Size: size equals dates  Presentation: unsure     Assessment:    Pregnancy @ 2676w1d weeks   Plan:     labs reviewed, problem list updated Consent signed. GBS sent TDAP offered  Rhogam given for RH negative Pediatrician: discussed. Infant feeding: plans to breastfeed. Maternity leave: discussed. Cigarette smoking: never smoked.  No orders of the defined types were placed in this encounter.  No orders of the defined types were placed in this encounter.  Follow up in 1 Week.

## 2015-12-29 ENCOUNTER — Encounter: Payer: Self-pay | Admitting: Obstetrics

## 2015-12-29 ENCOUNTER — Ambulatory Visit (INDEPENDENT_AMBULATORY_CARE_PROVIDER_SITE_OTHER): Payer: Medicaid Other | Admitting: Obstetrics

## 2015-12-29 VITALS — BP 129/80 | HR 97 | Temp 98.2°F | Wt 215.6 lb

## 2015-12-29 DIAGNOSIS — Z3493 Encounter for supervision of normal pregnancy, unspecified, third trimester: Secondary | ICD-10-CM

## 2015-12-29 LAB — POCT URINALYSIS DIPSTICK
BILIRUBIN UA: NEGATIVE
Blood, UA: NEGATIVE
GLUCOSE UA: NEGATIVE
Ketones, UA: NEGATIVE
NITRITE UA: NEGATIVE
Protein, UA: 1
Spec Grav, UA: 1.025
Urobilinogen, UA: 0.2
pH, UA: 5

## 2015-12-29 NOTE — Progress Notes (Signed)
Subjective:    Debra Wyatt is a 30 y.o. female being seen today for her obstetrical visit. She is at 3740w1d gestation. Patient reports no complaints. Fetal movement: normal.  Problem List Items Addressed This Visit    None    Visit Diagnoses    Prenatal care, third trimester    -  Primary   Relevant Orders   POCT Urinalysis Dipstick (Completed)   Strep Gp B NAA     There are no active problems to display for this patient.  Objective:    BP 129/80   Pulse 97   Temp 98.2 F (36.8 C)   Wt 215 lb 9.6 oz (97.8 kg)   LMP 03/13/2015   BMI 40.74 kg/m  FHT:  150 BPM  Uterine Size: size equals dates  Presentation: unsure     Assessment:    Pregnancy @ 8840w1d weeks   Plan:     labs reviewed, problem list updated Consent signed. GBS sent TDAP offered  Rhogam given for RH negative Pediatrician: discussed. Infant feeding: plans to breastfeed. Maternity leave: discussed. Cigarette smoking: never smoked.  Orders Placed This Encounter  Procedures  . Strep Gp B NAA  . POCT Urinalysis Dipstick   No orders of the defined types were placed in this encounter.  Follow up in 1 Week.

## 2015-12-29 NOTE — Progress Notes (Signed)
Pt c/o irregular contractions starting last week and vaginal pain/pressure.

## 2015-12-31 LAB — STREP GP B NAA: STREP GROUP B AG: NEGATIVE

## 2016-01-05 ENCOUNTER — Encounter: Payer: Self-pay | Admitting: Obstetrics

## 2016-01-05 ENCOUNTER — Ambulatory Visit (INDEPENDENT_AMBULATORY_CARE_PROVIDER_SITE_OTHER): Payer: Medicaid Other | Admitting: Obstetrics

## 2016-01-05 VITALS — BP 113/74 | HR 98 | Temp 98.8°F | Wt 217.0 lb

## 2016-01-05 DIAGNOSIS — Z3493 Encounter for supervision of normal pregnancy, unspecified, third trimester: Secondary | ICD-10-CM

## 2016-01-05 LAB — POCT URINALYSIS DIPSTICK
BILIRUBIN UA: NEGATIVE
GLUCOSE UA: NEGATIVE
Ketones, UA: NEGATIVE
Leukocytes, UA: NEGATIVE
NITRITE UA: NEGATIVE
RBC UA: NEGATIVE
Spec Grav, UA: 1.015
Urobilinogen, UA: 4
pH, UA: 6

## 2016-01-05 NOTE — Progress Notes (Signed)
Patient ID: Debra Wyatt, female   DOB: 1985-12-29, 30 y.o.   MRN: [id number] Subjective:    Debra Fuseatalie Davies is a 30 y.o. female being seen today for her obstetrical visit. She is at 818w1d gestation. Patient reports no complaints. Fetal movement: normal.  Problem List Items Addressed This Visit    None    Visit Diagnoses    Prenatal care, third trimester    -  Primary   Relevant Orders   POCT urinalysis dipstick (Completed)     There are no active problems to display for this patient.   Objective:    BP 113/74   Pulse 98   Temp 98.8 F (37.1 C)   Wt 217 lb (98.4 kg)   LMP 03/13/2015   BMI 41.00 kg/m  FHT: 150 BPM  Uterine Size: size equals dates  Presentations: unsure    Assessment:    Pregnancy @ 578w1d weeks   Plan:   Plans for delivery: Vaginal anticipated; labs reviewed; problem list updated Counseling: Consent signed. Infant feeding: plans to breastfeed. Cigarette smoking: never smoked. L&D discussion: symptoms of labor, discussed when to call, discussed what number to call, anesthetic/analgesic options reviewed and delivering clinician:  plans no preference. Postpartum supports and preparation: circumcision discussed and contraception plans discussed.  Follow up in 1 Week.

## 2016-01-05 NOTE — Progress Notes (Signed)
Patient reports no concerns- she does have some back pain

## 2016-01-11 ENCOUNTER — Inpatient Hospital Stay (HOSPITAL_COMMUNITY)
Admission: AD | Admit: 2016-01-11 | Discharge: 2016-01-11 | Disposition: A | Discharge status: Home / Self Care | Payer: Medicaid Other | Source: Ambulatory Visit | Attending: Obstetrics & Gynecology | Admitting: Obstetrics & Gynecology

## 2016-01-11 ENCOUNTER — Encounter (HOSPITAL_COMMUNITY): Payer: Self-pay

## 2016-01-11 DIAGNOSIS — J069 Acute upper respiratory infection, unspecified: Secondary | ICD-10-CM

## 2016-01-11 DIAGNOSIS — Z3A38 38 weeks gestation of pregnancy: Secondary | ICD-10-CM | POA: Insufficient documentation

## 2016-01-11 DIAGNOSIS — O99513 Diseases of the respiratory system complicating pregnancy, third trimester: Secondary | ICD-10-CM | POA: Insufficient documentation

## 2016-01-11 LAB — URINALYSIS, ROUTINE W REFLEX MICROSCOPIC
Bilirubin Urine: NEGATIVE
Glucose, UA: NEGATIVE mg/dL
KETONES UR: NEGATIVE mg/dL
NITRITE: NEGATIVE
PROTEIN: NEGATIVE mg/dL
Specific Gravity, Urine: 1.025 (ref 1.005–1.030)
pH: 5.5 (ref 5.0–8.0)

## 2016-01-11 LAB — URINE MICROSCOPIC-ADD ON: RBC / HPF: NONE SEEN RBC/hpf (ref 0–5)

## 2016-01-11 LAB — RAPID STREP SCREEN (MED CTR MEBANE ONLY): Streptococcus, Group A Screen (Direct): NEGATIVE

## 2016-01-11 NOTE — MAU Provider Note (Signed)
History     CSN: [id number]  Arrival date and time: 01/11/16 1404   First Provider Initiated Contact with Patient 01/11/16 1428      Chief Complaint  Patient presents with  . Fever  . Sore Throat  . Abdominal Pain   HPI Debra Wyatt 30 y.o. [redacted]w[redacted]d  Comes to MAU with fever and sore throat.  Took her temperature at home with her baby's thermometer under her arm and reports it was 101 degrees.  She took Tylenol and currently her temperature is normal.  She reports nasal congestion that she has had for a couple of days.  Her brother who she saw recently has a cold and sore throat.    OB History    Gravida Para Term Preterm AB Living   2 1 1     1    SAB TAB Ectopic Multiple Live Births           1      Past Medical History:  Diagnosis Date  . Medical history non-contributory     Past Surgical History:  Procedure Laterality Date  . TONSILLECTOMY      Family History  Problem Relation Age of Onset  . Hypertension Father   . Cancer Maternal Grandfather   . Diabetes Paternal Grandmother   . Diabetes Paternal Grandfather     Social History  Substance Use Topics  . Smoking status: Never Smoker  . Smokeless tobacco: Never Used  . Alcohol use No    Allergies: No Known Allergies  Prescriptions Prior to Admission  Medication Sig Dispense Refill Last Dose  . Prenat-Fe Poly-Methfol-FA-DHA (VITAFOL ULTRA) 29-0.6-0.4-200 MG CAPS Take 1 capsule by mouth daily before breakfast. 90 capsule 3 Taking    Review of Systems  Constitutional: Positive for fever.  Gastrointestinal: Positive for abdominal pain. Negative for nausea and vomiting.  Genitourinary:       No vaginal discharge. No vaginal bleeding or leaking. No dysuria.  Neurological: Negative for headaches.   Physical Exam   Blood pressure 116/82, pulse 119, temperature 98.2 F (36.8 C), temperature source Oral, resp. rate 18, height 5\' 3"  (1.6 m), weight 216 lb (98 kg), last menstrual period  03/13/2015.  Physical Exam  Nursing note and vitals reviewed. Constitutional: She is oriented to person, place, and time. She appears well-developed and well-nourished. No distress.  HENT:  Head: Normocephalic.  Mouth/Throat: No oropharyngeal exudate.  TM have good light reflex, very full bilaterally, no redness and no drainage noted. Pharynx very lightly pink.  Strep swab done.  Eyes: EOM are normal.  Neck: Neck supple.  GI: Soft. There is no tenderness. There is no rebound and no guarding.  Fetal monitor - irregular contractions  - likely braxton hicks.  Category 1 tracing with baseline of 140.  Musculoskeletal: Normal range of motion.  Neurological: She is alert and oriented to person, place, and time.  Skin: Skin is warm and dry.  Psychiatric: She has a normal mood and affect.    MAU Course  Procedures  MDM Client admits she is not in labor.  Called the office and was told to come in due to her fever.  She is currently afebrile and does not present as someone who is severely ill - make up on, sitting up in bed, smiling, conversing easily.  Likely has URI with postnasal drip which is the cause of her sore throat.  Able to eat and drink without any problems.  Assessment and Plan  URI in pregnancy  Plan  Drink at least 8 8-oz glasses of water every day. May use throat losenges May use OTC sudafed if congestion is severe. Continue Tylenol by the package directions. Return if there is ROM or if the contractions become stronger and more regular. Keep your appointment in the office on Tuesday.  [name] L Wasil Wolke 01/11/2016, 2:28 PM

## 2016-01-11 NOTE — Discharge Instructions (Signed)
Drink at least 8 8-oz glasses of water every day. May use throat losenges May use OTC sudafed if congestion is severe. Continue Tylenol by the package directions. Return if there is ROM or if the contractions become stronger and more regular. Keep your appointment in the office on Tuesday.

## 2016-01-11 NOTE — MAU Note (Signed)
Onset of fever highest at 101, sore throat and stomach pain started yesterday, took Tylenol today around 7311

## 2016-01-13 ENCOUNTER — Encounter: Payer: Self-pay | Admitting: Obstetrics

## 2016-01-13 ENCOUNTER — Ambulatory Visit (INDEPENDENT_AMBULATORY_CARE_PROVIDER_SITE_OTHER): Payer: Medicaid Other | Admitting: Obstetrics

## 2016-01-13 VITALS — BP 107/70 | HR 99 | Temp 98.2°F | Wt 217.3 lb

## 2016-01-13 DIAGNOSIS — Z3493 Encounter for supervision of normal pregnancy, unspecified, third trimester: Secondary | ICD-10-CM

## 2016-01-13 DIAGNOSIS — O368130 Decreased fetal movements, third trimester, not applicable or unspecified: Secondary | ICD-10-CM

## 2016-01-13 LAB — POCT URINALYSIS DIPSTICK
BILIRUBIN UA: NEGATIVE
GLUCOSE UA: NEGATIVE
NITRITE UA: NEGATIVE
PH UA: 5
RBC UA: NEGATIVE
Spec Grav, UA: 1.015
UROBILINOGEN UA: NEGATIVE

## 2016-01-13 LAB — CULTURE, GROUP A STREP (THRC)

## 2016-01-13 NOTE — Progress Notes (Signed)
Patient states she has decreased movement since she has been sick. She did go to MAU on Saturday and she was checked and sent home. She was put on NST and her throat was swabbed.

## 2016-01-13 NOTE — Progress Notes (Signed)
Patient ID: Debra Wyatt, female   DOB: 06-08-1985, 30 y.o.   MRN: [id number] Subjective:    Debra Fuseatalie Bamberg is a 30 y.o. female being seen today for her obstetrical visit. She is at 6069w2d gestation. Patient reports sore throat. Fetal movement: decreased.  Problem List Items Addressed This Visit    None    Visit Diagnoses    Prenatal care, third trimester    -  Primary   Relevant Orders   POCT urinalysis dipstick (Completed)   Decreased fetal movement, third trimester, not applicable or unspecified fetus       Relevant Orders   Fetal nonstress test     There are no active problems to display for this patient.   Objective:    BP 107/70   Pulse 99   Temp 98.2 F (36.8 C)   Wt 217 lb 4.8 oz (98.6 kg)   LMP 03/13/2015   BMI 38.49 kg/m  FHT: 150 BPM  Uterine Size: size equals dates  Presentations: cephalic   NST:  Reactive.  Good FM.  No decels.  Assessment:    Pregnancy @ 5069w2d weeks   URI  Plan:   Plans for delivery: Vaginal anticipated; labs reviewed; problem list updated Counseling: Consent signed. Infant feeding: plans to breastfeed. Cigarette smoking: never smoked. L&D discussion: symptoms of labor, discussed when to call, discussed what number to call, anesthetic/analgesic options reviewed and delivering clinician:  plans no preference. Postpartum supports and preparation: circumcision discussed and contraception plans discussed.  Follow up in 1 Week.

## 2016-01-13 NOTE — Patient Instructions (Signed)
Fetal Movement Counts  Patient Name: __________________________________________________ Patient Due Date: ____________________  Performing a fetal movement count is highly recommended in high-risk pregnancies, but it is good for every pregnant woman to do. Your health care provider may ask you to start counting fetal movements at 28 weeks of the pregnancy. Fetal movements often increase:  · After eating a full meal.  · After physical activity.  · After eating or drinking something sweet or cold.  · At rest.  Pay attention to when you feel the baby is most active. This will help you notice a pattern of your baby's sleep and wake cycles and what factors contribute to an increase in fetal movement. It is important to perform a fetal movement count at the same time each day when your baby is normally most active.   HOW TO COUNT FETAL MOVEMENTS  1. Find a quiet and comfortable area to sit or lie down on your left side. Lying on your left side provides the best blood and oxygen circulation to your baby.  2. Write down the day and time on a sheet of paper or in a journal.  3. Start counting kicks, flutters, swishes, rolls, or jabs in a 2-hour period. You should feel at least 10 movements within 2 hours.  4. If you do not feel 10 movements in 2 hours, wait 2-3 hours and count again. Look for a change in the pattern or not enough counts in 2 hours.  SEEK MEDICAL CARE IF:  · You feel less than 10 counts in 2 hours, tried twice.  · There is no movement in over an hour.  · The pattern is changing or taking longer each day to reach 10 counts in 2 hours.  · You feel the baby is not moving as he or she usually does.  Date: ____________ Movements: ____________ Start time: ____________ Finish time: ____________   Date: ____________ Movements: ____________ Start time: ____________ Finish time: ____________  Date: ____________ Movements: ____________ Start time: ____________ Finish time: ____________  Date: ____________ Movements:  ____________ Start time: ____________ Finish time: ____________  Date: ____________ Movements: ____________ Start time: ____________ Finish time: ____________  Date: ____________ Movements: ____________ Start time: ____________ Finish time: ____________  Date: ____________ Movements: ____________ Start time: ____________ Finish time: ____________  Date: ____________ Movements: ____________ Start time: ____________ Finish time: ____________   Date: ____________ Movements: ____________ Start time: ____________ Finish time: ____________  Date: ____________ Movements: ____________ Start time: ____________ Finish time: ____________  Date: ____________ Movements: ____________ Start time: ____________ Finish time: ____________  Date: ____________ Movements: ____________ Start time: ____________ Finish time: ____________  Date: ____________ Movements: ____________ Start time: ____________ Finish time: ____________  Date: ____________ Movements: ____________ Start time: ____________ Finish time: ____________  Date: ____________ Movements: ____________ Start time: ____________ Finish time: ____________   Date: ____________ Movements: ____________ Start time: ____________ Finish time: ____________  Date: ____________ Movements: ____________ Start time: ____________ Finish time: ____________  Date: ____________ Movements: ____________ Start time: ____________ Finish time: ____________  Date: ____________ Movements: ____________ Start time: ____________ Finish time: ____________  Date: ____________ Movements: ____________ Start time: ____________ Finish time: ____________  Date: ____________ Movements: ____________ Start time: ____________ Finish time: ____________  Date: ____________ Movements: ____________ Start time: ____________ Finish time: ____________   Date: ____________ Movements: ____________ Start time: ____________ Finish time: ____________  Date: ____________ Movements: ____________ Start time: ____________ Finish  time: ____________  Date: ____________ Movements: ____________ Start time: ____________ Finish time: ____________  Date: ____________ Movements: ____________ Start time:   ____________ Finish time: ____________  Date: ____________ Movements: ____________ Start time: ____________ Finish time: ____________  Date: ____________ Movements: ____________ Start time: ____________ Finish time: ____________  Date: ____________ Movements: ____________ Start time: ____________ Finish time: ____________   Date: ____________ Movements: ____________ Start time: ____________ Finish time: ____________  Date: ____________ Movements: ____________ Start time: ____________ Finish time: ____________  Date: ____________ Movements: ____________ Start time: ____________ Finish time: ____________  Date: ____________ Movements: ____________ Start time: ____________ Finish time: ____________  Date: ____________ Movements: ____________ Start time: ____________ Finish time: ____________  Date: ____________ Movements: ____________ Start time: ____________ Finish time: ____________  Date: ____________ Movements: ____________ Start time: ____________ Finish time: ____________   Date: ____________ Movements: ____________ Start time: ____________ Finish time: ____________  Date: ____________ Movements: ____________ Start time: ____________ Finish time: ____________  Date: ____________ Movements: ____________ Start time: ____________ Finish time: ____________  Date: ____________ Movements: ____________ Start time: ____________ Finish time: ____________  Date: ____________ Movements: ____________ Start time: ____________ Finish time: ____________  Date: ____________ Movements: ____________ Start time: ____________ Finish time: ____________  Date: ____________ Movements: ____________ Start time: ____________ Finish time: ____________   Date: ____________ Movements: ____________ Start time: ____________ Finish time: ____________  Date: ____________  Movements: ____________ Start time: ____________ Finish time: ____________  Date: ____________ Movements: ____________ Start time: ____________ Finish time: ____________  Date: ____________ Movements: ____________ Start time: ____________ Finish time: ____________  Date: ____________ Movements: ____________ Start time: ____________ Finish time: ____________  Date: ____________ Movements: ____________ Start time: ____________ Finish time: ____________  Date: ____________ Movements: ____________ Start time: ____________ Finish time: ____________   Date: ____________ Movements: ____________ Start time: ____________ Finish time: ____________  Date: ____________ Movements: ____________ Start time: ____________ Finish time: ____________  Date: ____________ Movements: ____________ Start time: ____________ Finish time: ____________  Date: ____________ Movements: ____________ Start time: ____________ Finish time: ____________  Date: ____________ Movements: ____________ Start time: ____________ Finish time: ____________  Date: ____________ Movements: ____________ Start time: ____________ Finish time: ____________     This information is not intended to replace advice given to you by your health care provider. Make sure you discuss any questions you have with your health care provider.     Document Released: 05/26/2006 Document Revised: 05/17/2014 Document Reviewed: 02/21/2012  [employer] Interactive Patient Education ©2016 [employer]

## 2016-01-21 ENCOUNTER — Encounter: Payer: Self-pay | Admitting: Advanced Practice Midwife

## 2016-01-21 DIAGNOSIS — Z3480 Encounter for supervision of other normal pregnancy, unspecified trimester: Secondary | ICD-10-CM | POA: Insufficient documentation

## 2016-01-22 ENCOUNTER — Ambulatory Visit (INDEPENDENT_AMBULATORY_CARE_PROVIDER_SITE_OTHER): Payer: Medicaid Other | Admitting: Advanced Practice Midwife

## 2016-01-22 VITALS — BP 119/83 | HR 83 | Temp 99.5°F | Wt 217.0 lb

## 2016-01-22 DIAGNOSIS — O368130 Decreased fetal movements, third trimester, not applicable or unspecified: Secondary | ICD-10-CM

## 2016-01-22 DIAGNOSIS — Z3493 Encounter for supervision of normal pregnancy, unspecified, third trimester: Secondary | ICD-10-CM

## 2016-01-22 NOTE — Progress Notes (Signed)
Patient stated that in the last 2 weeks she has experienced more pressure and irregular contractions, no vaginal bleeding. Patient also states that she has yellow discharge from vagina that started this week, no odor or discomfort.

## 2016-01-22 NOTE — Progress Notes (Signed)
   PRENATAL VISIT NOTE  Subjective:  Debra Wyatt is a 30 y.o. G2P1001 at 6567w4d being seen today for ongoing prenatal care.  She is currently monitored for the following issues for this low-risk pregnancy and has Supervision of normal subsequent pregnancy on her problem list.  Patient reports irregular but painful contractions and mucus discharge x 2 days.  Contractions: Irregular. Vag. Bleeding: None.  Movement: Present. Denies leaking of fluid.   The following portions of the patient's history were reviewed and updated as appropriate: allergies, current medications, past family history, past medical history, past social history, past surgical history and problem list. Problem list updated.  Objective:   Vitals:   01/22/16 1520  BP: 119/83  Pulse: 83  Temp: 99.5 F (37.5 C)  Weight: 217 lb (98.4 kg)    Fetal Status: Fetal Heart Rate (bpm): 125 Fundal Height: 39 cm Movement: Present     General:  Alert, oriented and cooperative. Patient is in no acute distress.  Skin: Skin is warm and dry. No rash noted.   Cardiovascular: Normal heart rate noted  Respiratory: Normal respiratory effort, no problems with respiration noted  Abdomen: Soft, gravid, appropriate for gestational age. Pain/Pressure: Present     Pelvic:  Cervical exam performed Dilation: 3.5 Effacement (%): 70 Station: -2  Extremities: Normal range of motion.  Edema: None  Mental Status: Normal mood and affect. Normal behavior. Normal judgment and thought content.   Urinalysis: Urine Protein: Negative Urine Glucose: Negative  Assessment and Plan:  Pregnancy: G2P1001 at 4967w4d  1. Prenatal care, third trimester --FHT wnl but audible HR in 100s, but doppler recorded only 120s.  NST-R today in office.  2. Decreased fetal movement, third trimester, not applicable or unspecified fetus --Pt doing kick counts and has 10mvmts in 2 hours usually but long stretches with little movement. She is concerned about the movement  today.   --NST-R  Term labor symptoms and general obstetric precautions including but not limited to vaginal bleeding, contractions, leaking of fluid and fetal movement were reviewed in detail with the patient. Please refer to After Visit Summary for other counseling recommendations.  Return in about 1 week (around 01/29/2016).  Hurshel PartyLisa A Leftwich-Kirby, CNM

## 2016-01-22 NOTE — Patient Instructions (Signed)
Labor Precautions Reasons to come to [facility]:  1.  Contractions are  5 minutes apart or less, each last 1 minute, these have been going on for 1-2 hours, and you cannot walk or talk during them 2.  You have a large gush of fluid, or a trickle of fluid that will not stop and you have to wear a pad 3.  You have bleeding that is bright red, heavier than spotting--like menstrual bleeding (spotting can be normal in early labor or after a check of your cervix) 4.  You do not feel the baby moving like he/she normally does Third Trimester of Pregnancy The third trimester is from week 29 through week 42, months 7 through 9. The third trimester is a time when the fetus is growing rapidly. At the end of the ninth month, the fetus is about 20 inches in length and weighs 6-10 pounds.  BODY CHANGES Your body goes through many changes during pregnancy. The changes vary from woman to woman.   Your weight will continue to increase. You can expect to gain 25-35 pounds (11-16 kg) by the end of the pregnancy.  You may begin to get stretch marks on your hips, abdomen, and breasts.  You may urinate more often because the fetus is moving lower into your pelvis and pressing on your bladder.  You may develop or continue to have heartburn as a result of your pregnancy.  You may develop constipation because certain hormones are causing the muscles that push waste through your intestines to slow down.  You may develop hemorrhoids or swollen, bulging veins (varicose veins).  You may have pelvic pain because of the weight gain and pregnancy hormones relaxing your joints between the bones in your pelvis. Backaches may result from overexertion of the muscles supporting your posture.  You may have changes in your hair. These can include thickening of your hair, rapid growth, and changes in texture. Some women also have hair loss during or after pregnancy, or hair that feels dry or thin. Your hair will most likely return to  normal after your baby is born.  Your breasts will continue to grow and be tender. A yellow discharge may leak from your breasts called colostrum.  Your belly button may stick out.  You may feel short of breath because of your expanding uterus.  You may notice the fetus "dropping," or moving lower in your abdomen.  You may have a bloody mucus discharge. This usually occurs a few days to a week before labor begins.  Your cervix becomes thin and soft (effaced) near your due date. WHAT TO EXPECT AT YOUR PRENATAL EXAMS  You will have prenatal exams every 2 weeks until week 36. Then, you will have weekly prenatal exams. During a routine prenatal visit:  You will be weighed to make sure you and the fetus are growing normally.  Your blood pressure is taken.  Your abdomen will be measured to track your baby's growth.  The fetal heartbeat will be listened to.  Any test results from the previous visit will be discussed.  You may have a cervical check near your due date to see if you have effaced. At around 36 weeks, your caregiver will check your cervix. At the same time, your caregiver will also perform a test on the secretions of the vaginal tissue. This test is to determine if a type of bacteria, Group B streptococcus, is present. Your caregiver will explain this further. Your caregiver may ask you:  What   your birth plan is.  How you are feeling.  If you are feeling the baby move.  If you have had any abnormal symptoms, such as leaking fluid, bleeding, severe headaches, or abdominal cramping.  If you are using any tobacco products, including cigarettes, chewing tobacco, and electronic cigarettes.  If you have any questions. Other tests or screenings that may be performed during your third trimester include:  Blood tests that check for low iron levels (anemia).  Fetal testing to check the health, activity level, and growth of the fetus. Testing is done if you have certain medical  conditions or if there are problems during the pregnancy.  HIV (human immunodeficiency virus) testing. If you are at high risk, you may be screened for HIV during your third trimester of pregnancy. FALSE LABOR You may feel small, irregular contractions that eventually go away. These are called Braxton Hicks contractions, or false labor. Contractions may last for hours, days, or even weeks before true labor sets in. If contractions come at regular intervals, intensify, or become painful, it is best to be seen by your caregiver.  SIGNS OF LABOR   Menstrual-like cramps.  Contractions that are 5 minutes apart or less.  Contractions that start on the top of the uterus and spread down to the lower abdomen and back.  A sense of increased pelvic pressure or back pain.  A watery or bloody mucus discharge that comes from the vagina. If you have any of these signs before the 37th week of pregnancy, call your caregiver right away. You need to go to the hospital to get checked immediately. HOME CARE INSTRUCTIONS   Avoid all smoking, herbs, alcohol, and unprescribed drugs. These chemicals affect the formation and growth of the baby.  Do not use any tobacco products, including cigarettes, chewing tobacco, and electronic cigarettes. If you need help quitting, ask your health care provider. You may receive counseling support and other resources to help you quit.  Follow your caregiver's instructions regarding medicine use. There are medicines that are either safe or unsafe to take during pregnancy.  Exercise only as directed by your caregiver. Experiencing uterine cramps is a good sign to stop exercising.  Continue to eat regular, healthy meals.  Wear a good support bra for breast tenderness.  Do not use hot tubs, steam rooms, or saunas.  Wear your seat belt at all times when driving.  Avoid raw meat, uncooked cheese, cat litter boxes, and soil used by cats. These carry germs that can cause birth  defects in the baby.  Take your prenatal vitamins.  Take 1500-2000 mg of calcium daily starting at the 20th week of pregnancy until you deliver your baby.  Try taking a stool softener (if your caregiver approves) if you develop constipation. Eat more high-fiber foods, such as fresh vegetables or fruit and whole grains. Drink plenty of fluids to keep your urine clear or pale yellow.  Take warm sitz baths to soothe any pain or discomfort caused by hemorrhoids. Use hemorrhoid cream if your caregiver approves.  If you develop varicose veins, wear support hose. Elevate your feet for 15 minutes, 3-4 times a day. Limit salt in your diet.  Avoid heavy lifting, wear low heal shoes, and practice good posture.  Rest a lot with your legs elevated if you have leg cramps or low back pain.  Visit your dentist if you have not gone during your pregnancy. Use a soft toothbrush to brush your teeth and be gentle when you floss.    A sexual relationship may be continued unless your caregiver directs you otherwise.  Do not travel far distances unless it is absolutely necessary and only with the approval of your caregiver.  Take prenatal classes to understand, practice, and ask questions about the labor and delivery.  Make a trial run to the hospital.  Pack your hospital bag.  Prepare the baby's nursery.  Continue to go to all your prenatal visits as directed by your caregiver. SEEK MEDICAL CARE IF:  You are unsure if you are in labor or if your water has broken.  You have dizziness.  You have mild pelvic cramps, pelvic pressure, or nagging pain in your abdominal area.  You have persistent nausea, vomiting, or diarrhea.  You have a bad smelling vaginal discharge.  You have pain with urination. SEEK IMMEDIATE MEDICAL CARE IF:   You have a fever.  You are leaking fluid from your vagina.  You have spotting or bleeding from your vagina.  You have severe abdominal cramping or pain.  You have  rapid weight loss or gain.  You have shortness of breath with chest pain.  You notice sudden or extreme swelling of your face, hands, ankles, feet, or legs.  You have not felt your baby move in over an hour.  You have severe headaches that do not go away with medicine.  You have vision changes.   This information is not intended to replace advice given to you by your health care provider. Make sure you discuss any questions you have with your health care provider.   Document Released: 04/20/2001 Document Revised: 05/17/2014 Document Reviewed: 06/27/2012 Elsevier Interactive Patient Education 2016 Else[employer] Inc[employer]  

## 2016-01-23 ENCOUNTER — Encounter (HOSPITAL_COMMUNITY): Payer: Self-pay

## 2016-01-23 ENCOUNTER — Inpatient Hospital Stay (HOSPITAL_COMMUNITY)
Admission: AD | Admit: 2016-01-23 | Discharge: 2016-01-25 | DRG: 775 | Disposition: A | Discharge status: Home / Self Care | Payer: Medicaid Other | Source: Ambulatory Visit | Attending: Obstetrics and Gynecology | Admitting: Obstetrics and Gynecology

## 2016-01-23 DIAGNOSIS — Z8249 Family history of ischemic heart disease and other diseases of the circulatory system: Secondary | ICD-10-CM | POA: Diagnosis not present

## 2016-01-23 DIAGNOSIS — Z3483 Encounter for supervision of other normal pregnancy, third trimester: Secondary | ICD-10-CM | POA: Diagnosis present

## 2016-01-23 DIAGNOSIS — Z3A39 39 weeks gestation of pregnancy: Secondary | ICD-10-CM

## 2016-01-23 DIAGNOSIS — Z833 Family history of diabetes mellitus: Secondary | ICD-10-CM

## 2016-01-23 LAB — ABO/RH: ABO/RH(D): O POS

## 2016-01-23 LAB — CBC
HCT: 34.7 % — ABNORMAL LOW (ref 36.0–46.0)
HEMOGLOBIN: 12 g/dL (ref 12.0–15.0)
MCH: 27.1 pg (ref 26.0–34.0)
MCHC: 34.6 g/dL (ref 30.0–36.0)
MCV: 78.5 fL (ref 78.0–100.0)
Platelets: 290 10*3/uL (ref 150–400)
RBC: 4.42 MIL/uL (ref 3.87–5.11)
RDW: 13.9 % (ref 11.5–15.5)
WBC: 11.7 10*3/uL — ABNORMAL HIGH (ref 4.0–10.5)

## 2016-01-23 LAB — TYPE AND SCREEN
ABO/RH(D): O POS
Antibody Screen: NEGATIVE

## 2016-01-23 MED ORDER — TRAMADOL HCL 50 MG PO TABS: 50.0000 mg | ORAL_TABLET | Freq: Four times a day (QID) | ORAL | Status: DC | PRN

## 2016-01-23 MED ORDER — SOD CITRATE-CITRIC ACID 500-334 MG/5ML PO SOLN: 30.0000 mL | ORAL | Status: DC | PRN

## 2016-01-23 MED ORDER — LACTATED RINGERS IV SOLN: 500.0000 mL | INTRAVENOUS | Status: DC | PRN

## 2016-01-23 MED ORDER — FLEET ENEMA 7-19 GM/118ML RE ENEM: 1.0000 | ENEMA | Freq: Once | RECTAL | Status: DC

## 2016-01-23 MED ORDER — EPHEDRINE 5 MG/ML INJ
10.0000 mg | INTRAVENOUS | Status: DC | PRN
  Filled 2016-01-23: qty 4

## 2016-01-23 MED ORDER — DIPHENHYDRAMINE HCL 50 MG/ML IJ SOLN: 12.5000 mg | INTRAMUSCULAR | Status: DC | PRN

## 2016-01-23 MED ORDER — SIMETHICONE 80 MG PO CHEW: 80.0000 mg | CHEWABLE_TABLET | ORAL | Status: DC | PRN

## 2016-01-23 MED ORDER — DIBUCAINE 1 % RE OINT
1.0000 "application " | TOPICAL_OINTMENT | RECTAL | Status: DC | PRN
  Administered 2016-01-24: 1 via RECTAL
  Filled 2016-01-23: qty 28

## 2016-01-23 MED ORDER — SENNOSIDES-DOCUSATE SODIUM 8.6-50 MG PO TABS
2.0000 | ORAL_TABLET | ORAL | Status: DC
  Administered 2016-01-24 – 2016-01-25 (×2): 2 via ORAL
  Filled 2016-01-23 (×2): qty 2

## 2016-01-23 MED ORDER — COCONUT OIL OIL: 1.0000 "application " | TOPICAL_OIL | Status: DC | PRN

## 2016-01-23 MED ORDER — PHENYLEPHRINE 40 MCG/ML (10ML) SYRINGE FOR IV PUSH (FOR BLOOD PRESSURE SUPPORT)
80.0000 ug | PREFILLED_SYRINGE | INTRAVENOUS | Status: DC | PRN
  Filled 2016-01-23: qty 5

## 2016-01-23 MED ORDER — LACTATED RINGERS IV SOLN: 500.0000 mL | Freq: Once | INTRAVENOUS | Status: DC

## 2016-01-23 MED ORDER — LACTATED RINGERS IV SOLN
INTRAVENOUS | Status: DC
  Administered 2016-01-23: 125 mL/h via INTRAVENOUS

## 2016-01-23 MED ORDER — ACETAMINOPHEN 325 MG PO TABS: 650.0000 mg | ORAL_TABLET | ORAL | Status: DC | PRN

## 2016-01-23 MED ORDER — OXYTOCIN BOLUS FROM INFUSION
500.0000 mL | Freq: Once | INTRAVENOUS | Status: AC
  Administered 2016-01-23: 500 mL via INTRAVENOUS

## 2016-01-23 MED ORDER — ONDANSETRON HCL 4 MG/2ML IJ SOLN: 4.0000 mg | Freq: Four times a day (QID) | INTRAMUSCULAR | Status: DC | PRN

## 2016-01-23 MED ORDER — ZOLPIDEM TARTRATE 5 MG PO TABS: 5.0000 mg | ORAL_TABLET | Freq: Every evening | ORAL | Status: DC | PRN

## 2016-01-23 MED ORDER — IBUPROFEN 600 MG PO TABS
600.0000 mg | ORAL_TABLET | Freq: Four times a day (QID) | ORAL | Status: DC
  Administered 2016-01-23 – 2016-01-25 (×9): 600 mg via ORAL
  Filled 2016-01-23 (×9): qty 1

## 2016-01-23 MED ORDER — ONDANSETRON HCL 4 MG PO TABS: 4.0000 mg | ORAL_TABLET | ORAL | Status: DC | PRN

## 2016-01-23 MED ORDER — TETANUS-DIPHTH-ACELL PERTUSSIS 5-2.5-18.5 LF-MCG/0.5 IM SUSP
0.5000 mL | Freq: Once | INTRAMUSCULAR | Status: AC
  Administered 2016-01-24: 0.5 mL via INTRAMUSCULAR
  Filled 2016-01-23: qty 0.5

## 2016-01-23 MED ORDER — LIDOCAINE HCL (PF) 1 % IJ SOLN
30.0000 mL | INTRAMUSCULAR | Status: DC | PRN
  Filled 2016-01-23: qty 30

## 2016-01-23 MED ORDER — DIPHENHYDRAMINE HCL 25 MG PO CAPS: 25.0000 mg | ORAL_CAPSULE | Freq: Four times a day (QID) | ORAL | Status: DC | PRN

## 2016-01-23 MED ORDER — ONDANSETRON HCL 4 MG/2ML IJ SOLN: 4.0000 mg | INTRAMUSCULAR | Status: DC | PRN

## 2016-01-23 MED ORDER — OXYCODONE-ACETAMINOPHEN 5-325 MG PO TABS
2.0000 | ORAL_TABLET | ORAL | Status: DC | PRN
  Administered 2016-01-23: 2 via ORAL
  Filled 2016-01-23: qty 2

## 2016-01-23 MED ORDER — WITCH HAZEL-GLYCERIN EX PADS
1.0000 "application " | MEDICATED_PAD | CUTANEOUS | Status: DC | PRN
  Administered 2016-01-24: 1 via TOPICAL

## 2016-01-23 MED ORDER — LIDOCAINE HCL (PF) 1 % IJ SOLN
INTRAMUSCULAR | Status: AC
  Filled 2016-01-23: qty 30

## 2016-01-23 MED ORDER — PRENATAL MULTIVITAMIN CH
1.0000 | ORAL_TABLET | Freq: Every day | ORAL | Status: DC
  Administered 2016-01-24 – 2016-01-25 (×2): 1 via ORAL
  Filled 2016-01-23 (×2): qty 1

## 2016-01-23 MED ORDER — OXYCODONE-ACETAMINOPHEN 5-325 MG PO TABS: 1.0000 | ORAL_TABLET | ORAL | Status: DC | PRN

## 2016-01-23 MED ORDER — BENZOCAINE-MENTHOL 20-0.5 % EX AERO
1.0000 "application " | INHALATION_SPRAY | CUTANEOUS | Status: DC | PRN
  Administered 2016-01-23: 1 via TOPICAL
  Filled 2016-01-23: qty 56

## 2016-01-23 MED ORDER — MEASLES, MUMPS & RUBELLA VAC ~~LOC~~ INJ
0.5000 mL | INJECTION | Freq: Once | SUBCUTANEOUS | Status: AC
  Administered 2016-01-25: 0.5 mL via SUBCUTANEOUS
  Filled 2016-01-23: qty 0.5

## 2016-01-23 MED ORDER — OXYTOCIN 40 UNITS IN LACTATED RINGERS INFUSION - SIMPLE MED
2.5000 [IU]/h | INTRAVENOUS | Status: DC
  Administered 2016-01-23: 2.5 [IU]/h via INTRAVENOUS

## 2016-01-23 MED ORDER — OXYTOCIN 40 UNITS IN LACTATED RINGERS INFUSION - SIMPLE MED
INTRAVENOUS | Status: AC
  Administered 2016-01-23: 2.5 [IU]/h via INTRAVENOUS
  Filled 2016-01-23: qty 1000

## 2016-01-23 MED ORDER — INFLUENZA VAC SPLIT QUAD 0.5 ML IM SUSY
0.5000 mL | PREFILLED_SYRINGE | INTRAMUSCULAR | Status: AC
  Administered 2016-01-25: 0.5 mL via INTRAMUSCULAR

## 2016-01-23 MED ORDER — FENTANYL 2.5 MCG/ML BUPIVACAINE 1/10 % EPIDURAL INFUSION (WH - ANES)
14.0000 mL/h | INTRAMUSCULAR | Status: DC | PRN
Start: 2016-01-23 — End: 2016-01-23

## 2016-01-23 NOTE — Lactation Note (Addendum)
This note was copied from a baby's chart. Lactation Consultation Note  Patient Name: Debra Wyatt ZOXWR'UToday's Date: 01/23/2016 Reason for consult: Initial assessment   Initial consult with first time BF mom of < 1 hour old infant in [facility]. Infant was swaddled and being held by a visitor in the room, she was in a quiet alert state. Mom reports her older child did not latch and she pumped for 5 months and bottle fed infant. Mom plans to breast and bottle feed this infant, she reports so she can give formula once she goes back to work. Discussed Supply and demand, colostrum, and milk coming to volume. Enc mom to exclusively BF for 2-4 weeks to establish a good supply, mom voiced understanding. Discussed that if mom chooses to give formula sooner to always BF first before giving formula.   Mom with large compressible breasts and bifurcated everted nipples. Unswaddled infant and placed STS. Assisted mom in latching infant to right breast in cross cradle and football holds, infant would latch for a few sucks and turn loose. We then placed her to the left breast in cross cradle hold and she did latch with flanged lips and rhythmic sucking, intermittent swallows were noted. She was still BF when I left the room. She did need relatching a few times throughout the feeding. Enc mom to massage/compress breast with feeding. Cautioned mom to obtained deep latch as infant slips off the top of nipple. Discussed EBM to nipples post feed, mom voiced she remembers how to hand express. Colostrum easily expressible from both breasts.    Reviewed positioning with mom and NB nutritional needs. Enc mom to feed 8-12 x in 24 hours at first feeding cues.   Mom communicates well in [place] and declined interpreter and requested handouts in [place]. BF Resources Hand out and LC Brochure given, Informed of IP/OP Services, BF Support Groups and LC phone #. Enc mom to call out to desk for feeding assistance as needed.  Follow up tomorrow and prn.   Maternal Data Formula Feeding for Exclusion: Yes Reason for exclusion: Mother's choice to formula and breast feed on admission Has patient been taught Hand Expression?: Yes Does the patient have breastfeeding experience prior to this delivery?: Yes  Feeding Feeding Type: Breast Fed Length of feed: 15 min  LATCH Score/Interventions Latch: Grasps breast easily, tongue down, lips flanged, rhythmical sucking.  Audible Swallowing: A few with stimulation  Type of Nipple: Everted at rest and after stimulation (Bifurcated nipples)  Comfort (Breast/Nipple): Soft / non-tender     Hold (Positioning): Assistance needed to correctly position infant at breast and maintain latch. Intervention(s): Breastfeeding basics reviewed;Support Pillows;Position options;Skin to skin  LATCH Score: 8  Lactation Tools Discussed/Used WIC Program: Yes   Consult Status Consult Status: Follow-up Date: 01/24/16 Follow-up type: In-patient    Silas FloodSharon S Saraphina Lauderbaugh 01/23/2016, 10:54 AM

## 2016-01-23 NOTE — H&P (Signed)
LABOR AND DELIVERY ADMISSION HISTORY AND PHYSICAL NOTE  Debra Wyatt is a 30 y.o. female G2P1001 with IUP at 60w5dby 14.3 wk U/S presenting for SOL.   She reports positive fetal movement. She denies leakage of fluid or vaginal bleeding.  Prenatal History/Complications: none  Past Medical History: Past Medical History:  Diagnosis Date  . Medical history non-contributory     Past Surgical History: Past Surgical History:  Procedure Laterality Date  . TONSILLECTOMY      Obstetrical History: OB History    Gravida Para Term Preterm AB Living   2 1 1     1    SAB TAB Ectopic Multiple Live Births           1      Social History: Social History   Social History  . Marital status: Single    Spouse name: N/A  . Number of children: N/A  . Years of education: N/A   Social History Main Topics  . Smoking status: Never Smoker  . Smokeless tobacco: Never Used  . Alcohol use No  . Drug use: No  . Sexual activity: Yes    Birth control/ protection: None   Other Topics Concern  . Not on file   Social History Narrative  . No narrative on file    Family History: Family History  Problem Relation Age of Onset  . Hypertension Father   . Cancer Maternal Grandfather   . Diabetes Paternal Grandmother   . Diabetes Paternal Grandfather     Allergies: No Known Allergies  Prescriptions Prior to Admission  Medication Sig Dispense Refill Last Dose  . acetaminophen (TYLENOL) 325 MG tablet Take 650 mg by mouth every 6 (six) hours as needed for mild pain.   Not Taking  . Prenat-Fe Poly-Methfol-FA-DHA (VITAFOL ULTRA) 29-0.6-0.4-200 MG CAPS Take 1 capsule by mouth daily before breakfast. 90 capsule 3 Taking     Review of Systems   All systems reviewed and negative except as stated in HPI  Blood pressure 126/65, pulse (!) 102, resp. rate 18, height 5' 3"  (1.6 m), weight 98.4 kg (217 lb), last menstrual period 03/13/2015. General appearance: alert, cooperative and mild  distress Lungs: no respiratory distress Heart: regular rate Abdomen: soft, non-tender Extremities: No calf swelling or tenderness Presentation: cephalic by cervical exam Fetal monitoring: 125 baseline, moderate variability, +accelerations, no decelerations Uterine activity: q3-431m Dilation: 10 Effacement (%): 100 Station: 0 Exam by:: dr yoShawna Wyatt Prenatal labs: ABO, Rh: O/Positive/-- (03/15 1432) Antibody: Negative (03/15 1432) Rubella: nonimmune RPR: Non Reactive (06/26 1115)  HBsAg: Negative (03/15 1432)  HIV: Non Reactive (06/26 1115)  GBS: Negative (08/21 1039)  Genetic screening:  declined Anatomy [place]  Prenatal Transfer Tool  Maternal Diabetes: No Genetic Screening: Declined Maternal Ultrasounds/Referrals: Normal Fetal Ultrasounds or other Referrals:  None Maternal Substance Abuse:  No Significant Maternal Medications:  None Significant Maternal Lab Results: Lab values include: Group B Strep negative  Results for orders placed or performed during the hospital encounter of 01/23/16 (from the past 24 hour(s))  CBC   Collection Time: 01/23/16  9:00 AM  Result Value Ref Range   WBC 11.7 (H) 4.0 - 10.5 K/uL   RBC 4.42 3.87 - 5.11 MIL/uL   Hemoglobin 12.0 12.0 - 15.0 g/dL   HCT 34.7 (L) 36.0 - 46.0 %   MCV 78.5 78.0 - 100.0 fL   MCH 27.1 26.0 - 34.0 pg   MCHC 34.6 30.0 - 36.0 g/dL   RDW 13.9  11.5 - 15.5 %   Platelets 290 150 - 400 K/uL    Patient Active Problem List   Diagnosis Date Noted  . Labor and delivery indication for care or intervention 01/23/2016  . Supervision of normal subsequent pregnancy 01/21/2016    Assessment: Debra Wyatt is a 30 y.o. G2P1001 at 96w5dhere for SOL  #Labor:expectant management, now AROM to light mec #Pain: Labor support #FWB: Category I #ID:  GBS neg #MOF: br #MOC:not discussed  #Circ:  Girl, NBrusly DO PGY-1 9/15/201710:24 AM  CNM attestation:  I have seen and examined this patient; I agree  with above documentation in the resident's note.   NNalany Steedleyis a 30y.o. [id number] for active labor.  PE: BP 117/60 (BP Location: Left Arm)   Pulse 76   Temp 98.1 F (36.7 C) (Oral)   Resp 16   Ht 5' 3"  (1.6 m)   Wt 98.4 kg (217 lb)   LMP 03/13/2015   Breastfeeding? Unknown   BMI 38.44 kg/m   Resp: normal effort, no distress Abd: gravid  ROS, labs, PMH reviewed  [facility]: [facility] Anticipate [facility]VD Offer MMR PP  SHAW, KHuntington9/15/2017, 12:59 PM

## 2016-01-24 LAB — RPR: RPR Ser Ql: NONREACTIVE

## 2016-01-24 MED ORDER — IBUPROFEN 600 MG PO TABS: 600.0000 mg | ORAL_TABLET | Freq: Four times a day (QID) | ORAL | 0 refills | Status: DC | PRN

## 2016-01-24 NOTE — Discharge Instructions (Signed)

## 2016-01-24 NOTE — Plan of Care (Signed)
Problem: Nutritional: Goal: Mother's verbalization of comfort with breastfeeding process will improve Outcome: Completed/Met Date Met: 01/24/16 Started with DEBP and educated on pumping and finger/syringe feeding formula per mother request. See progress note.

## 2016-01-24 NOTE — Progress Notes (Signed)
Mother states fear of infant not getting enough milk because she is cluster feeding with no breaks in between. She is exhausted and mother has not gotten any rest since delivery.Infant latching fairly well but bottom lip seems very tight and tends to stay curled inward. Mother denies any discomfort with latching. Mother states that she is afraid that baby's tight frenulum is preventing her from getting enough breast milk. Mother also states that her plan was to breast and bottle feed; however, other nurses told her not to bottle feed and would not give her formula. Gave mother eduction about risks of bottle feeding and benefits of breast feeding.Gave mother options of how to feed infant formula without giving artificial nipple. Mother would like to try finger/syringe feeding or syringe feeding formula while breast feeding. Educated mother on limiting amount of formula and gave formula preparation/measurement information sheet. Also gave and explained DEBP with mother and encouraged to pump after each feeding. Mother verbalizes understanding and feels much more comfortable with this plan so she knows baby is getting fed well. Encouraged mother to call if any assistance needed and when baby latches so we can observe. Earl Galasborne, Linda HedgesStefanie State CenterHudspeth

## 2016-01-24 NOTE — Progress Notes (Signed)
Pediatrician would like to keep baby overnight. Called Cam HaiKimberly Shaw to cancel discharge. Kim ordered to cancel discharge for today. Earl Galasborne, Linda HedgesStefanie West EndHudspeth

## 2016-01-24 NOTE — Discharge Summary (Signed)
OB Discharge Summary     Patient Name: Debra Wyatt DOB: [birth year] MRN: [id number]  Date of admission: 01/23/2016 Delivering MD: Bufford Lope   Date of discharge: 01/24/2016  Admitting diagnosis: 39WKS,LABOR Intrauterine pregnancy: [redacted]w[redacted]d    Secondary diagnosis:  Active Problems:   Labor and delivery indication for care or intervention  Additional problems: none     Discharge diagnosis: Term Pregnancy Delivered                                                                                                Post partum procedures:offered MMR  Augmentation: N/A  Complications: None  Hospital course:  Onset of Labor With Vaginal Delivery     30y.o. yo [id number] 377w5das admitted in Active Labor on 01/23/2016. Patient had an uncomplicated labor course as follows:  Membrane Rupture Time/Date: 9:22 AM ,01/23/2016   Intrapartum Procedures: Episiotomy: None [1]                                         Lacerations:  None [1]  Patient had a delivery of a Viable infant. 01/23/2016  Information for the patient's newborn:  ViKate, Larock0[[id number]]Delivery Method: Vaginal, Spontaneous Delivery (Filed from Delivery Summary)    Pateint had an uncomplicated postpartum course.  She is ambulating, tolerating a regular diet, passing flatus, and urinating well. Patient is discharged home in stable condition on 01/24/16.    Physical exam Vitals:   01/23/16 1158 01/23/16 1330 01/23/16 1700 01/24/16 0610  BP: 117/60 117/67 116/70 94/64  Pulse: 76 69 76 64  Resp: 16 17 16 16   Temp: 98.1 F (36.7 C) 97.9 F (36.6 C) 97.9 F (36.6 C) 97.8 F (36.6 C)  TempSrc: Oral Oral Oral   Weight:      Height:       General: alert and cooperative Lochia: appropriate Uterine Fundus: firm Incision: N/A DVT Evaluation: No evidence of DVT seen on physical exam. Labs: Lab Results  Component Value Date   WBC 11.7 (H) 01/23/2016   HGB 12.0 01/23/2016   HCT 34.7 (L) 01/23/2016   MCV 78.5 01/23/2016   PLT 290 01/23/2016   No flowsheet data found.  Discharge instruction: per After Visit Summary and "Baby and Me Booklet".  After visit meds:    Medication List    STOP taking these medications   acetaminophen 325 MG tablet Commonly known as:  TYLENOL     TAKE these medications   ibuprofen 600 MG tablet Commonly known as:  ADVIL,MOTRIN Take 1 tablet (600 mg total) by mouth every 6 (six) hours as needed.   VITAFOL ULTRA 29-0.6-0.4-200 MG Caps Take 1 capsule by mouth daily before breakfast.       Diet: routine diet  Activity: Advance as tolerated. Pelvic rest for 6 weeks.   Outpatient follow up:6 weeks Follow up Appt:No future appointments. Follow up Visit:No Follow-up on file.  Postpartum contraception: Depo Provera  Newborn Data: Live born female  Birth  Weight: 9 lb 4.8 oz (4218 g) APGAR: 8, 9  Baby Feeding: Breast Disposition:home with mother   01/24/2016 Serita Grammes, CNM  8:57 AM

## 2016-01-24 NOTE — Lactation Note (Signed)
This note was copied from a baby's chart. Lactation Consultation Note  Patient Name: Debra Wyatt WUJWJ'XToday's Date: 01/24/2016 Reason for consult: Follow-up assessment   Follow up with mom of 35 hour old infant. Infant with 16 BF for 10-60 minutes, 3 cc EBM x 1 via syringe, 4 cc formula via syringe, 4 voids and 1 stool in last 24 hours preceding this assessment. Infant has had 1 stool in her life and mom reports it was a large amount. Infant weight 9 lb 2.4 oz with weight loss of 2% since birth. LATCH Scores 8-9 by bedside RN's.  Mom reports infant has had times of sleeping longer between feeds and she feels the infant is getting enough. She reports she has given infant some supplement and the infant did not like the formula. She has a pump and has pumped twice, she reports she got small amounts. Enc her to hand express post pumping and to feed all EBM back to infant. Discussed voids, stools and weight being measures to determine if infant is feeding well. Advised mom that if weight is an issue after weight tonight then the plan would be for her to pump and hand express every 3 hours and feed all EBM to infant via Syringe, mom voiced understanding. Colostrum was easily expressible from breasts and mom reports breasts feel much heavier today.   Infant was feeding when I went into the room, she was noted to have frequent swallows. Infant fell asleep at breast and when she came off the breast she was noted to be rooting, mom relatched her and she began feeding again. Infant is noted to have anterior lingual frenulum, mom denies pain with latch.   Mom is pleased infant will Bf as her first child would not. She is tired and was able to get a 3 hour nap earlier when infant slept. Enc her to call for assistance as needed. Follow up tomorrow.    Maternal Data Formula Feeding for Exclusion: Yes Reason for exclusion: Mother's choice to formula and breast feed on admission Has patient been taught Hand  Expression?: Yes Does the patient have breastfeeding experience prior to this delivery?: Yes  Feeding Feeding Type: Breast Fed Length of feed: 25 min (Infant still feeding when I left the room)  LATCH Score/Interventions Latch: Repeated attempts needed to sustain latch, nipple held in mouth throughout feeding, stimulation needed to elicit sucking reflex. Intervention(s): Adjust position;Assist with latch;Breast massage;Breast compression  Audible Swallowing: Spontaneous and intermittent  Type of Nipple: Everted at rest and after stimulation  Comfort (Breast/Nipple): Soft / non-tender     Hold (Positioning): No assistance needed to correctly position infant at breast. Intervention(s): Breastfeeding basics reviewed;Support Pillows;Position options;Skin to skin  LATCH Score: 9  Lactation Tools Discussed/Used Pump Review: Setup, frequency, and cleaning   Consult Status Consult Status: Follow-up Date: 01/25/16 Follow-up type: In-patient    Silas FloodSharon S Elzie Sheets 01/24/2016, 9:24 PM

## 2016-01-25 NOTE — Lactation Note (Signed)
This note was copied from a baby's chart. Lactation Consultation Note: Mom reports that baby has been feeding better especially on the right breast. Still having some trouble getting latched to left breast. Reports milk is changing on the right breast. Baby last fed 2 hours ago. Offered assist and mom agreeable. Baby latched well but then goes to sleep. Swallows noted for a few minutes of feeding then baby sleepy and non nutritive. Needed stimulation to continue nursing. Anterior tongue tie noted. Mom reports no pain with latch. Baby has had 4 voids but no stool in the last 24 hours. Encouraged mom to pump at least 4-6 times/day more if possible and to feed all EBM to baby. Mom pumping as I left room. To syringe feed EBM to baby when she finishes pumping. No questions at present. To call for assist prn.   Patient Name: Girl Luna Fuseatalie Casas ZOXWR'UToday's Date: 01/25/2016 Reason for consult: Follow-up assessment   Maternal Data Formula Feeding for Exclusion: Yes Reason for exclusion: Mother's choice to formula and breast feed on admission Has patient been taught Hand Expression?: Yes Does the patient have breastfeeding experience prior to this delivery?: Yes  Feeding Feeding Type: Breast Fed Length of feed: 15 min  LATCH Score/Interventions Latch: Grasps breast easily, tongue down, lips flanged, rhythmical sucking.  Audible Swallowing: A few with stimulation  Type of Nipple: Everted at rest and after stimulation  Comfort (Breast/Nipple): Soft / non-tender     Hold (Positioning): Assistance needed to correctly position infant at breast and maintain latch. Intervention(s): Breastfeeding basics reviewed  LATCH Score: 8  Lactation Tools Discussed/Used WIC Program: No   Consult Status Consult Status: Follow-up Date: 01/26/16 Follow-up type: In-patient    Pamelia HoitWeeks, Cyprian Gongaware D 01/25/2016, 8:38 AM

## 2016-01-25 NOTE — Discharge Summary (Signed)
OB Discharge Summary     Patient Name: Debra Wyatt DOB: 1986-01-13 MRN: [id number]  Date of admission: 01/23/2016 Delivering MD: Bufford Lope   Date of discharge: 01/25/2016  Admitting diagnosis: 39WKS,LABOR Intrauterine pregnancy: [redacted]w[redacted]d    Secondary diagnosis:  Active Problems:   Labor and delivery indication for care or intervention  Additional problems: none     Discharge diagnosis: Term Pregnancy Delivered                                                                                                Post partum procedures:offered MMR  Augmentation: N/A  Complications: None  Hospital course:  Onset of Labor With Vaginal Delivery     30y.o. yo [name] admitted in Active Labor on 01/23/2016. Patient had an uncomplicated labor course as follows:  Membrane Rupture Time/Date: 9:22 AM ,01/23/2016   Intrapartum Procedures: Episiotomy: None [1]                                         Lacerations:  None [1]  Patient had a delivery of a Viable infant. 01/23/2016  Information for the patient's newborn:  [name], Enck0[[id number]]Delivery Method: Vaginal, Spontaneous Delivery (Filed from Delivery Summary)    Pateint had an uncomplicated postpartum course.  She is ambulating, tolerating a regular diet, passing flatus, and urinating well. Patient is discharged home in stable condition on 01/25/16.    Physical exam  Vitals:   01/23/16 1700 01/24/16 0610 01/24/16 1735 01/25/16 0516  BP: 116/70 94/64 133/66 126/67  Pulse: 76 64 72 67  Resp: 16 16 18 18   Temp: 97.9 F (36.6 C) 97.8 F (36.6 C) 97.9 F (36.6 C) 98.5 F (36.9 C)  TempSrc: Oral  Oral Oral  Weight:      Height:       General: alert and cooperative Lochia: appropriate Uterine Fundus: firm Incision: N/A DVT Evaluation: No evidence of DVT seen on physical exam. No edema Labs: Lab Results  Component Value Date   WBC 11.7 (H) 01/23/2016   HGB 12.0 01/23/2016   HCT 34.7 (L)  01/23/2016   MCV 78.5 01/23/2016   PLT 290 01/23/2016   No flowsheet data found.  Discharge instruction: per After Visit Summary and "Baby and Me Booklet".  After visit meds:    Medication List    STOP taking these medications   acetaminophen 325 MG tablet Commonly known as:  TYLENOL     TAKE these medications   ibuprofen 600 MG tablet Commonly known as:  ADVIL,MOTRIN Take 1 tablet (600 mg total) by mouth every 6 (six) hours as needed.   VITAFOL ULTRA 29-0.6-0.4-200 MG Caps Take 1 capsule by mouth daily before breakfast.       Diet: routine diet  Activity: Advance as tolerated. Pelvic rest for 6 weeks.   Outpatient follow up:6 weeks Follow up Appt:No future appointments. Follow up Visit:No Follow-up on file.  Postpartum contraception: Depo Provera  Newborn Data: Live  born female  Birth Weight: 9 lb 4.8 oz (4218 g) APGAR: 8, 9  Baby Feeding: Breast Disposition:home with mother   01/25/2016 Gailen Shelter, MD  7:16 AM   I spoke with and examined patient and agree with resident/PA/SNM's note and plan of care.  Abstinence until IUD placement  Roma Schanz, CNM, [facility] 01/25/2016 8:26 AM

## 2016-01-26 ENCOUNTER — Ambulatory Visit: Payer: Self-pay

## 2016-01-26 NOTE — Lactation Note (Signed)
This note was copied from a baby's chart. Lactation Consultation Note  Patient Name: Debra Luna Fuseatalie Kalp UJWJX'BToday's Date: 01/26/2016  Mom states baby is breastfeeding well.  She post pumps and obtains 30-40 mls.  Mom is syringe feeding baby expressed milk.  She states breasts are comfortable.  Baby receiving phototherapy.  Encouraged to continue plan and call for concerns/assist as needed.   Maternal Data    Feeding    LATCH Score/Interventions                      Lactation Tools Discussed/Used     Consult Status      Huston FoleyMOULDEN, Debra Wyatt S 01/26/2016, 10:09 AM

## 2016-01-27 ENCOUNTER — Ambulatory Visit: Payer: Self-pay

## 2016-01-27 NOTE — Lactation Note (Signed)
This note was copied from a baby's chart. Lactation Consultation Note  Patient Name: Debra Wyatt'BToday's Date: 01/27/2016 Reason for consult: Follow-up assessment;Infant weight loss (4% weight loss , mom , baby ready for D/C )  Per mom milk is in and was able to pump off 40 plus ml this am and fed with a bottle. LC reviewed sore nipple and engorgement prevention and tx . Mom denies sore nipples. Referring to the Baby and me booklet, esp. Pages 24 -25 . LC stressed the importance of giving baby practice at the breast .  Mother informed of post-discharge support and given phone number to the lactation department, including services for  phone call assistance; out-patient appointments; and breastfeeding support group. List of other breastfeeding resources  in the community given in the handout. Encouraged mother to call for problems or concerns related to breastfeeding.   Maternal Data Has patient been taught Hand Expression?:  (per mom comfortable with hand expressing hand pumping )  Feeding Feeding Type:  (per mom recently fed the bay EBM from a bottle ) Length of feed: 35 min  LATCH Score/Interventions                Intervention(s): Breastfeeding basics reviewed     Lactation Tools Discussed/Used     Consult Status Consult Status: Complete Date: 01/27/16    Kathrin Greathouseorio, Yzabella Crunk Ann 01/27/2016, 9:16 AM

## 2016-03-08 ENCOUNTER — Encounter: Payer: Self-pay | Admitting: Obstetrics

## 2016-03-08 ENCOUNTER — Ambulatory Visit (INDEPENDENT_AMBULATORY_CARE_PROVIDER_SITE_OTHER): Payer: Medicaid Other | Admitting: Obstetrics

## 2016-03-08 ENCOUNTER — Other Ambulatory Visit (HOSPITAL_COMMUNITY)
Admission: RE | Admit: 2016-03-08 | Discharge: 2016-03-08 | Disposition: A | Discharge status: Home / Self Care | Payer: Medicaid Other | Source: Ambulatory Visit | Attending: Obstetrics | Admitting: Obstetrics

## 2016-03-08 VITALS — BP 126/78 | HR 63 | Temp 97.1°F | Wt 193.4 lb

## 2016-03-08 DIAGNOSIS — Z3009 Encounter for other general counseling and advice on contraception: Secondary | ICD-10-CM

## 2016-03-08 DIAGNOSIS — Z01419 Encounter for gynecological examination (general) (routine) without abnormal findings: Secondary | ICD-10-CM | POA: Insufficient documentation

## 2016-03-08 DIAGNOSIS — Z113 Encounter for screening for infections with a predominantly sexual mode of transmission: Secondary | ICD-10-CM

## 2016-03-08 DIAGNOSIS — Z124 Encounter for screening for malignant neoplasm of cervix: Secondary | ICD-10-CM

## 2016-03-08 DIAGNOSIS — Z3202 Encounter for pregnancy test, result negative: Secondary | ICD-10-CM

## 2016-03-08 DIAGNOSIS — Z0000 Encounter for general adult medical examination without abnormal findings: Secondary | ICD-10-CM

## 2016-03-08 LAB — POCT URINE PREGNANCY: Preg Test, Ur: NEGATIVE

## 2016-03-08 NOTE — Progress Notes (Signed)
Subjective:     Debra Wyatt is a 30 y.o. female who presents for a postpartum visit. She is 6 weeks postpartum following a spontaneous vaginal delivery. I have fully reviewed the prenatal and intrapartum course. The delivery was at 39 gestational weeks. Outcome: spontaneous vaginal delivery. Anesthesia: none. Postpartum course has been normal. Baby's course has been normal. Baby is feeding by both breast and bottle - Similac Advance. Bleeding no bleeding. Bowel function is complicated by constipation. Bladder function is normal. Patient is sexually active. Contraception method is condoms. Postpartum depression screening: negative.  Tobacco, alcohol and substance abuse history reviewed.  Adult immunizations reviewed including TDAP, rubella and varicella.  The following portions of the patient's history were reviewed and updated as appropriate: allergies, current medications, past family history, past medical history, past social history, past surgical history and problem list.  Review of Systems A comprehensive review of systems was negative.   Objective:    BP 126/78   Pulse 63   Temp 97.1 F (36.2 C)   Wt 193 lb 6.4 oz (87.7 kg)   Breastfeeding? Yes   BMI 34.26 kg/m   General:  alert and no distress   Breasts:  inspection negative, no nipple discharge or bleeding, no masses or nodularity palpable  Lungs: clear to auscultation bilaterally  Heart:  regular rate and rhythm, S1, S2 normal, no murmur, click, rub or gallop  Abdomen: soft, non-tender; bowel sounds normal; no masses,  no organomegaly   Vulva:  normal  Vagina: normal vagina  Cervix:  no cervical motion tenderness  Corpus: normal size, contour, position, consistency, mobility, non-tender  Adnexa:  no mass, fullness, tenderness  Rectal Exam: Not performed.          50% of 15 min visit spent on counseling and coordination of care.   Assessment:     Normal postpartum exam. Pap smear done at today's visit.    Plan:     1. Contraception: IUD 2. Mirena IUD Rx 3. Follow up in: 2 weeks or as needed.   Healthy lifestyle practices reviewed

## 2016-03-10 LAB — CYTOLOGY - PAP: Diagnosis: NEGATIVE

## 2016-03-11 LAB — NUSWAB VG+, CANDIDA 6SP
CANDIDA ALBICANS, NAA: NEGATIVE
CANDIDA GLABRATA, NAA: NEGATIVE
CANDIDA KRUSEI, NAA: NEGATIVE
CANDIDA LUSITANIAE, NAA: NEGATIVE
CANDIDA PARAPSILOSIS, NAA: NEGATIVE
CHLAMYDIA TRACHOMATIS, NAA: NEGATIVE
Candida tropicalis, NAA: NEGATIVE
NEISSERIA GONORRHOEAE, NAA: NEGATIVE
Trich vag by NAA: NEGATIVE

## 2016-03-23 ENCOUNTER — Ambulatory Visit (INDEPENDENT_AMBULATORY_CARE_PROVIDER_SITE_OTHER): Payer: Medicaid Other | Admitting: Obstetrics

## 2016-03-23 ENCOUNTER — Encounter: Payer: Self-pay | Admitting: Obstetrics

## 2016-03-23 VITALS — BP 124/87 | HR 64 | Temp 98.9°F | Ht 63.0 in | Wt 196.6 lb

## 2016-03-23 DIAGNOSIS — Z3202 Encounter for pregnancy test, result negative: Secondary | ICD-10-CM | POA: Diagnosis not present

## 2016-03-23 DIAGNOSIS — Z30430 Encounter for insertion of intrauterine contraceptive device: Secondary | ICD-10-CM | POA: Diagnosis not present

## 2016-03-23 LAB — POCT URINE PREGNANCY: PREG TEST UR: NEGATIVE

## 2016-03-23 NOTE — Addendum Note (Signed)
Addended by: Francene FindersJAMES, Ronne Stefanski C on: 03/23/2016 03:35 PM   Modules accepted: Orders

## 2016-03-23 NOTE — Progress Notes (Signed)
IUD Insertion Procedure Note  Pre-operative Diagnosis: Desire Contraception  Post-operative Diagnosis: same  Indications: contraception  Procedure Details  Urine pregnancy test was done in office and result was negative.  The risks (including infection, bleeding, pain, and uterine perforation) and benefits of the procedure were explained to the patient and Written informed consent was obtained.    Cervix cleansed with Betadine. Uterus sounded to 8 cm. IUD inserted without difficulty. String visible and trimmed. Patient tolerated procedure well.  IUD Information: Mirena, Lot # [id number], Expiration date 04 / 20.  Condition: Stable  Complications: None  Plan:  The patient was advised to call for any fever or for prolonged or severe pain or bleeding. She was advised to use NSAID as needed for mild to moderate pain.   Attending Physician Documentation: I was present for or participated in the entire procedure, including opening and closing.

## 2016-05-04 ENCOUNTER — Ambulatory Visit (INDEPENDENT_AMBULATORY_CARE_PROVIDER_SITE_OTHER): Payer: Medicaid Other | Admitting: Obstetrics

## 2016-05-04 ENCOUNTER — Encounter: Payer: Self-pay | Admitting: Obstetrics

## 2016-05-04 VITALS — BP 118/79 | HR 58 | Temp 98.0°F | Wt 196.0 lb

## 2016-05-04 DIAGNOSIS — Z30431 Encounter for routine checking of intrauterine contraceptive device: Secondary | ICD-10-CM

## 2016-05-04 DIAGNOSIS — Z309 Encounter for contraceptive management, unspecified: Secondary | ICD-10-CM

## 2016-05-04 NOTE — Progress Notes (Signed)
Subjective:    Debra Wyatt is a 30 y.o. female who presents for contraception counseling. The patient has no complaints today. The patient is sexually active. Pertinent past medical history: none.  The information documented in the HPI was reviewed and verified.  Menstrual History: OB History    Gravida Para Term Preterm AB Living   2 2 2     2    SAB TAB Ectopic Multiple Live Births         0 2       No LMP recorded. Patient is not currently having periods (Reason: Lactating).   Patient Active Problem List   Diagnosis Date Noted  . Labor and delivery indication for care or intervention 01/23/2016  . Supervision of normal subsequent pregnancy 01/21/2016   Past Medical History:  Diagnosis Date  . Medical history non-contributory     Past Surgical History:  Procedure Laterality Date  . TONSILLECTOMY       Current Outpatient Prescriptions:  .  docusate sodium (COLACE) 100 MG capsule, Take 100 mg by mouth 2 (two) times daily., Disp: , Rfl:  .  FENUGREEK PO, Take by mouth., Disp: , Rfl:  .  ibuprofen (ADVIL,MOTRIN) 600 MG tablet, Take 1 tablet (600 mg total) by mouth every 6 (six) hours as needed. (Patient not taking: Reported on 05/04/2016), Disp: 30 tablet, Rfl: 0 .  Prenat-Fe Poly-Methfol-FA-DHA (VITAFOL ULTRA) 29-0.6-0.4-200 MG CAPS, Take 1 capsule by mouth daily before breakfast. (Patient not taking: Reported on 05/04/2016), Disp: 90 capsule, Rfl: 3 No Known Allergies  Social History  Substance Use Topics  . Smoking status: Never Smoker  . Smokeless tobacco: Never Used  . Alcohol use No    Family History  Problem Relation Age of Onset  . Hypertension Father   . Cancer Maternal Grandfather   . Diabetes Paternal Grandmother   . Diabetes Paternal Grandfather        Review of Systems Constitutional: negative for weight loss Genitourinary:negative for abnormal menstrual periods and vaginal discharge   Objective:   BP 118/79   Pulse (!) 58   Temp 98 F  (36.7 C) (Oral)   Wt 196 lb (88.9 kg)   Breastfeeding? Yes   BMI 34.72 kg/m           General:  Alert and no distress Abdomen:  normal findings: no organomegaly, soft, non-tender and no hernia  Pelvis:  External genitalia: normal general appearance Urinary system: urethral meatus normal and bladder without fullness, nontender Vaginal: normal without tenderness, induration or masses Cervix: normal appearance.  IUD string visible and normal length  Adnexa: normal bimanual exam Uterus: anteverted and non-tender, normal size   Lab Review Urine pregnancy test Labs reviewed yes Radiologic studies reviewed no  50% of 15 min visit spent on counseling and coordination of care.    Assessment:    30 y.o., continuing Mirena IUD, no contraindications.   Plan:    All questions answered. Contraception: IUD. Discussed healthy lifestyle modifications. Agricultural engineerducational material distributed. Follow up in 10 months. Pap smear No orders of the defined types were placed in this encounter.  No orders of the defined types were placed in this encounter.

## 2017-04-11 ENCOUNTER — Other Ambulatory Visit: Payer: Self-pay

## 2017-04-11 ENCOUNTER — Telehealth: Payer: Self-pay

## 2017-04-11 ENCOUNTER — Other Ambulatory Visit (HOSPITAL_COMMUNITY)
Admission: RE | Admit: 2017-04-11 | Discharge: 2017-04-11 | Disposition: A | Discharge status: Home / Self Care | Payer: Medicaid Other | Source: Ambulatory Visit | Attending: Obstetrics | Admitting: Obstetrics

## 2017-04-11 ENCOUNTER — Encounter: Payer: Self-pay | Admitting: Obstetrics

## 2017-04-11 ENCOUNTER — Ambulatory Visit (INDEPENDENT_AMBULATORY_CARE_PROVIDER_SITE_OTHER): Payer: Medicaid Other | Admitting: Obstetrics

## 2017-04-11 VITALS — BP 122/83 | HR 76 | Ht 63.0 in | Wt 202.8 lb

## 2017-04-11 DIAGNOSIS — Z309 Encounter for contraceptive management, unspecified: Secondary | ICD-10-CM

## 2017-04-11 DIAGNOSIS — Z01419 Encounter for gynecological examination (general) (routine) without abnormal findings: Secondary | ICD-10-CM

## 2017-04-11 DIAGNOSIS — Z30431 Encounter for routine checking of intrauterine contraceptive device: Secondary | ICD-10-CM

## 2017-04-11 NOTE — Progress Notes (Signed)
Subjective:        Debra Wyatt is a 31 y.o. female here for a routine exam.  Current complaints: None.    Personal health questionnaire:  Is patient Ashkenazi Jewish, have a family history of breast and/or ovarian cancer: no Is there a family history of uterine cancer diagnosed at age < 8650, gastrointestinal cancer, urinary tract cancer, family member who is a Personnel officerLynch syndrome-associated carrier: no Is the patient overweight and hypertensive, family history of diabetes, personal history of gestational diabetes, preeclampsia or PCOS: no Is patient over 1555, have PCOS,  family history of premature CHD under age 565, diabetes, smoke, have hypertension or peripheral artery disease:  no At any time, has a partner hit, kicked or otherwise hurt or frightened you?: no Over the past 2 weeks, have you felt down, depressed or hopeless?: no Over the past 2 weeks, have you felt little interest or pleasure in doing things?:no   Gynecologic History Patient's last menstrual period was 03/29/2017 (exact date). Contraception: IUD Last Pap: 2017. Results were: normal Last mammogram: n/a. Results were: n/a  Obstetric History OB History  Gravida Para Term Preterm AB Living  2 2 2     2   SAB TAB Ectopic Multiple Live Births        0 2    # Outcome Date GA Lbr Len/2nd Weight Sex Delivery Anes PTL Lv  2 Term 01/23/16 5760w5d 02:22 / 00:36 9 lb 4.8 oz (4.218 kg) F Vag-Spont None  LIV  1 Term 09/28/08 5067w0d  7 lb 6 oz (3.345 kg) M Vag-Spont EPI  LIV      Past Medical History:  Diagnosis Date  . Medical history non-contributory     Past Surgical History:  Procedure Laterality Date  . TONSILLECTOMY       Current Outpatient Medications:  .  docusate sodium (COLACE) 100 MG capsule, Take 100 mg by mouth 2 (two) times daily., Disp: , Rfl:  .  FENUGREEK PO, Take by mouth., Disp: , Rfl:  .  ibuprofen (ADVIL,MOTRIN) 600 MG tablet, Take 1 tablet (600 mg total) by mouth every 6 (six) hours as needed.  (Patient not taking: Reported on 05/04/2016), Disp: 30 tablet, Rfl: 0 .  Prenat-Fe Poly-Methfol-FA-DHA (VITAFOL ULTRA) 29-0.6-0.4-200 MG CAPS, Take 1 capsule by mouth daily before breakfast. (Patient not taking: Reported on 05/04/2016), Disp: 90 capsule, Rfl: 3 No Known Allergies  Social History   Tobacco Use  . Smoking status: Never Smoker  . Smokeless tobacco: Never Used  Substance Use Topics  . Alcohol use: No    Family History  Problem Relation Age of Onset  . Hypertension Father   . Cancer Maternal Grandfather   . Diabetes Paternal Grandmother   . Diabetes Paternal Grandfather       Review of Systems  Constitutional: negative for fatigue and weight loss Respiratory: negative for cough and wheezing Cardiovascular: negative for chest pain, fatigue and palpitations Gastrointestinal: negative for abdominal pain and change in bowel habits Musculoskeletal:negative for myalgias Neurological: negative for gait problems and tremors Behavioral/Psych: negative for abusive relationship, depression Endocrine: negative for temperature intolerance    Genitourinary:negative for abnormal menstrual periods, genital lesions, hot flashes, sexual problems and vaginal discharge Integument/breast: negative for breast lump, breast tenderness, nipple discharge and skin lesion(s)    Objective:       BP 122/83   Pulse 76   Ht 5\' 3"  (1.6 m)   Wt 202 lb 12.8 oz (92 kg)   LMP 03/29/2017 (Exact Date)  Breastfeeding? No   BMI 35.92 kg/m  General:   alert  Skin:   no rash or abnormalities  Lungs:   clear to auscultation bilaterally  Heart:   regular rate and rhythm, S1, S2 normal, no murmur, click, rub or gallop  Breasts:   normal without suspicious masses, skin or nipple changes or axillary nodes  Abdomen:  normal findings: no organomegaly, soft, non-tender and no hernia  Pelvis:  External genitalia: normal general appearance Urinary system: urethral meatus normal and bladder without  fullness, nontender Vaginal: normal without tenderness, induration or masses Cervix: normal appearance.  IUD string visible. Adnexa: normal bimanual exam Uterus: anteverted and non-tender, normal size   Lab Review Urine pregnancy test Labs reviewed yes Radiologic studies reviewed no  50% of 20 min visit spent on counseling and coordination of care.   Assessment:     1. Encounter for routine gynecological examination with Papanicolaou smear of cervix Rx: - Cytology - PAP  2. Encounter for routine checking of intrauterine contraceptive device (IUD)    Plan:    Education reviewed: calcium supplements, depression evaluation, low fat, low cholesterol diet, safe sex/STD prevention, self breast exams and weight bearing exercise. Contraception: IUD. Follow up in: 1 year.   No orders of the defined types were placed in this encounter.  No orders of the defined types were placed in this encounter.

## 2017-04-11 NOTE — Telephone Encounter (Signed)
error 

## 2017-04-11 NOTE — Progress Notes (Signed)
Presents for AEX. Wants PAP 

## 2017-04-14 ENCOUNTER — Other Ambulatory Visit: Payer: Self-pay | Admitting: Obstetrics

## 2017-04-14 LAB — CYTOLOGY - PAP
DIAGNOSIS: UNDETERMINED — AB
HPV (WINDOPATH): NOT DETECTED

## 2017-04-14 NOTE — Progress Notes (Signed)
Pt informed

## 2020-05-10 NOTE — L&D Delivery Note (Addendum)
OB/GYN Faculty Practice Delivery Note  Debra Wyatt is a 35 y.o. G3P2002 s/p SVD at [redacted]w[redacted]d. She was admitted for SROM.   ROM: 19h 50m with Clear fluid GBS Status: Negative/-- (10/12 1619) Maximum Maternal Temperature: 99.8  Labor Progress: Patient presented to L&D for SROM. Initial SVE: 3.5/90/-3. She then progressed to complete.   Delivery Date/Time: 03/12/21 @0346  Delivery: Called to room and patient was complete and pushing. Head delivered LOA. No nuchal cord present. Shoulder dystocia requiring McRoberts, suprapubic pressure and posterior arm delivery. Body delivered in usual fashion. Cord clamped x 2 and cut by myself. Infant was immediately taken to warmer for support by NICU team. Cord blood drawn. Placenta delivered spontaneously with gentle cord traction. Fundus firm with massage and Pitocin. Labia, perineum, vagina, and cervix inspected without laceration. Placenta was examined after and found to be intact with normal 3-vessel cord.   Placenta: Intact Complications: Shoulder dystocia Lacerations: None EBL: Analgesia: Epidural  Postpartum Planning [x]  message to sent to schedule follow-up  [ ]  vaccines UTD  Infant: APGARs 9 @5min   pending weight  , DO 03/12/2021, 4:14 AM PGY-1, [facility]   Attestation:  I confirm that I have verified the information documented in the resident's note and that I have also personally reperformed the physical exam and all medical decision making activities.   I was gloved and present for entire delivery SVD accomplished after over 2 hours of pushing Head delivered and Turtle sign noted.  No nuchal cord  Dr called to room at this point and he arrived quickly.  Attempted delivery of anterior shoulder with McRoberts maneuver, SP pressure and I was able to grasp the anterior (right) shoulder.  It partially delivered so I grasped the posterior (left) axilla and delivered both in succession.     Total time was 1 minute Baby was limp and taken to warmer to awaiting NICU team Cord pH obtained, resulted as 7.3 No lacerations  , CNM    Please schedule this patient for Postpartum visit in: 4 weeks with the following provider: Any provider Either virtual or in person, but will need glucola postpartum (could be done as lab visit) For C/S patients schedule nurse incision check in weeks 2 weeks: no High risk pregnancy complicated by: GDM Delivery mode:  SVD Anticipated Birth Control:  IUD PP Procedures needed: 2 hour GTT  Edinburgh: negative Schedule Integrated BH visit: no  No relevant baby issues

## 2020-08-11 ENCOUNTER — Other Ambulatory Visit: Payer: Self-pay

## 2020-08-11 ENCOUNTER — Other Ambulatory Visit: Payer: Self-pay | Admitting: *Deleted

## 2020-08-11 DIAGNOSIS — Z124 Encounter for screening for malignant neoplasm of cervix: Secondary | ICD-10-CM

## 2020-08-11 NOTE — Progress Notes (Signed)
Patient: Debra Wyatt           Date of Birth: 1985-10-09           MRN: [id number] Visit Date: 08/11/2020 PCP: Patient, No Pcp Per (Inactive)  Cervical Cancer Screening Do you smoke?: No Have you ever had or been told you have an allergy to latex products?: No Marital status: Single Date of last pap smear: 2-5 yrs ago (04/11/17) Date of last menstrual period:  (irregular) Number of pregnancies: 2 Number of births: 2 Have you ever had any of the following? Hysterectomy: No Tubal ligation (tubes tied): No Abnormal bleeding: No Abnormal pap smear: Yes (04/11/17 (ASCUS, - HPV)) Venereal warts: No A sex partner with venereal warts: No A high risk* sex partner: No  Cervical Exam  Abnormal Observations: Cervix friable. Recommendations: Last Pap smear was 04/11/2017 at [facility] and ASCUS with negative HPV. Per patient her last Pap smear is the only abnormal Pap smear she has had. Last Pap smear result is available in Epic. Let patient know that next Pap smear will be due based on the result of today's Pap smear. Informed patient that will follow-up with her within the next couple of weeks with results of her Pap smear by phone or letter.       Patient's History Patient Active Problem List   Diagnosis Date Noted  . Labor and delivery indication for care or intervention 01/23/2016  . Supervision of normal subsequent pregnancy 01/21/2016   Past Medical History:  Diagnosis Date  . Medical history non-contributory     Family History  Problem Relation Age of Onset  . Hypertension Father   . Cancer Maternal Grandfather   . Diabetes Paternal Grandmother   . Diabetes Paternal Grandfather     Social History   Occupational History  . Not on file  Tobacco Use  . Smoking status: Never Smoker  . Smokeless tobacco: Never Used  Substance and Sexual Activity  . Alcohol use: No  . Drug use: No  . Sexual activity: Yes    Partners: Male    Birth  control/protection: None, Condom    Comment: condom filure 2 weeks ago

## 2020-08-14 ENCOUNTER — Telehealth: Payer: Self-pay

## 2020-08-14 LAB — CYTOLOGY - PAP
Comment: NEGATIVE
Diagnosis: NEGATIVE
High risk HPV: NEGATIVE

## 2020-08-14 NOTE — Telephone Encounter (Signed)
Attempted to contact patient to give pap results. Left name and number for patient to call back.

## 2020-08-15 ENCOUNTER — Telehealth: Payer: Self-pay

## 2020-08-15 NOTE — Telephone Encounter (Signed)
Attempted to call patient via [employer] [phone] to give pap results. Left name and number for patient to call back.

## 2020-08-15 NOTE — Telephone Encounter (Signed)
Via Julie Sowell, [employer], Patient informed negative Pap/HPV results, next pap smear due in 5 years. Patient verbalized understanding.  

## 2020-09-08 ENCOUNTER — Encounter (HOSPITAL_COMMUNITY): Payer: Self-pay | Admitting: Obstetrics and Gynecology

## 2020-09-08 ENCOUNTER — Inpatient Hospital Stay (HOSPITAL_BASED_OUTPATIENT_CLINIC_OR_DEPARTMENT_OTHER): Payer: Medicaid Other

## 2020-09-08 ENCOUNTER — Other Ambulatory Visit: Payer: Self-pay

## 2020-09-08 ENCOUNTER — Inpatient Hospital Stay (HOSPITAL_COMMUNITY)
Admission: AD | Admit: 2020-09-08 | Discharge: 2020-09-08 | Disposition: A | Discharge status: Home / Self Care | Payer: Medicaid Other | Attending: Obstetrics and Gynecology | Admitting: Obstetrics and Gynecology

## 2020-09-08 DIAGNOSIS — Z3A12 12 weeks gestation of pregnancy: Secondary | ICD-10-CM

## 2020-09-08 DIAGNOSIS — O4692 Antepartum hemorrhage, unspecified, second trimester: Secondary | ICD-10-CM | POA: Diagnosis present

## 2020-09-08 DIAGNOSIS — O4691 Antepartum hemorrhage, unspecified, first trimester: Secondary | ICD-10-CM | POA: Diagnosis not present

## 2020-09-08 DIAGNOSIS — O209 Hemorrhage in early pregnancy, unspecified: Secondary | ICD-10-CM

## 2020-09-08 DIAGNOSIS — O208 Other hemorrhage in early pregnancy: Secondary | ICD-10-CM | POA: Diagnosis not present

## 2020-09-08 DIAGNOSIS — O468X1 Other antepartum hemorrhage, first trimester: Secondary | ICD-10-CM

## 2020-09-08 DIAGNOSIS — O418X10 Other specified disorders of amniotic fluid and membranes, first trimester, not applicable or unspecified: Secondary | ICD-10-CM

## 2020-09-08 DIAGNOSIS — Z3A14 14 weeks gestation of pregnancy: Secondary | ICD-10-CM | POA: Insufficient documentation

## 2020-09-08 LAB — WET PREP, GENITAL
Clue Cells Wet Prep HPF POC: NONE SEEN
Sperm: NONE SEEN
Trich, Wet Prep: NONE SEEN
Yeast Wet Prep HPF POC: NONE SEEN

## 2020-09-08 LAB — GC/CHLAMYDIA PROBE AMP (~~LOC~~) NOT AT ARMC
Chlamydia: NEGATIVE
Comment: NEGATIVE
Comment: NORMAL
Neisseria Gonorrhea: NEGATIVE

## 2020-09-08 NOTE — Discharge Instructions (Signed)
Subchorionic Hematoma  A hematoma is a collection of blood outside of the blood vessels. A subchorionic hematoma is a collection of blood between the outer wall of the embryo (chorion) and the inner wall of the uterus. This condition can cause vaginal bleeding. Early small hematomas usually shrink on their own and do not affect your baby or pregnancy. When bleeding starts later in pregnancy, or if the hematoma is larger or occurs in older pregnant women, the condition may be more serious. Larger hematomas increase the chances of miscarriage. This condition also increases the risk of:  Premature separation of the placenta from the uterus.  Premature (preterm) labor.  Stillbirth. What are the causes? The exact cause of this condition is not known. It occurs when blood is trapped between the placenta and the uterine wall because the placenta has separated from the original site of implantation. What increases the risk? You are more likely to develop this condition if:  You were treated with fertility medicines.  You became pregnant through in vitro fertilization (IVF). What are the signs or symptoms? Symptoms of this condition include:  Vaginal spotting or bleeding.  Abdominal pain. This is rare. Sometimes you may have no symptoms and the bleeding may only be seen when ultrasound images are taken (transvaginal ultrasound). How is this diagnosed? This condition is diagnosed based on a physical exam. This includes a pelvic exam. You may also have other tests, including:  Blood tests.  Urine tests.  Ultrasound of the abdomen. How is this treated? Treatment for this condition can vary. Treatment may include:  Watchful waiting. You will be monitored closely for any changes in bleeding.  Medicines.  Activity restriction. This may be needed until the bleeding stops.  A medicine called Rh immunoglobulin. This is given if you have an Rh-negative blood type. It prevents Rh  sensitization. Follow these instructions at home:  Stay on bed rest if told to do so by your health care provider.  Do not lift anything that is heavier than 10 lb (4.5 kg), or the limit that you are told by your health care provider.  Track and write down the number of pads you use each day and how soaked (saturated) they are.  Do not use tampons.  Keep all follow-up visits. This is important. Your health care provider may ask you to have follow-up blood tests or ultrasound tests or both. Contact a health care provider if:  You have any vaginal bleeding.  You have a fever. Get help right away if:  You have severe cramps in your stomach, back, abdomen, or pelvis.  You pass large clots or tissue. Save any tissue for your health care provider to look at.  You faint.  You become light-headed or weak. Summary  A subchorionic hematoma is a collection of blood between the outer wall of the embryo (chorion) and the inner wall of the uterus.  This condition can cause vaginal bleeding.  Sometimes you may have no symptoms and the bleeding may only be seen when ultrasound images are taken.  Treatment may include watchful waiting, medicines, or activity restriction.  Keep all follow-up visits. Get help right away if you have severe cramps or heavy vaginal bleeding. This information is not intended to replace advice given to you by your health care provider. Make sure you discuss any questions you have with your health care provider. Document Revised: 01/21/2020 Document Reviewed: 01/21/2020 Elsevier Patient Education  2021 Elsevier Inc.   

## 2020-09-08 NOTE — MAU Provider Note (Signed)
History     CSN: [id number]  Arrival date and time: 09/08/20 0429   Event Date/Time   First Provider Initiated Contact with Patient 09/08/20 0501      Chief Complaint  Patient presents with  . Vaginal Bleeding   Briellah Baik is a 35 y.o. G3P2002 at [redacted]w[redacted]d by Unsure LMP who has not established PNC.  She presents today for Vaginal Bleeding.  She reports having some reddish brown discharge when using the bathroom.  She states it was noted in her underwear and in the toilet.  She denies cramping and reports some mucous type discharge yesterday. She reports recent sexual activity with some pain noted that resolved with position change. Patient goes on to report she has been spotting for the past 3 months and recently discovered she was pregnant ~ 3 weeks ago.    OB History    Gravida  3   Para  2   Term  2   Preterm      AB      Living  2     SAB      IAB      Ectopic      Multiple  0   Live Births  2           Past Medical History:  Diagnosis Date  . Medical history non-contributory     Past Surgical History:  Procedure Laterality Date  . TONSILLECTOMY      Family History  Problem Relation Age of Onset  . Hypertension Father   . Cancer Maternal Grandfather   . Diabetes Paternal Grandmother   . Diabetes Paternal Grandfather     Social History   Tobacco Use  . Smoking status: Never Smoker  . Smokeless tobacco: Never Used  Substance Use Topics  . Alcohol use: No  . Drug use: No    Allergies: No Known Allergies  Medications Prior to Admission  Medication Sig Dispense Refill Last Dose  . Prenat-Fe Poly-Methfol-FA-DHA (VITAFOL ULTRA) 29-0.6-0.4-200 MG CAPS Take 1 capsule by mouth daily before breakfast. 90 capsule 3 09/07/2020 at Unknown time  . docusate sodium (COLACE) 100 MG capsule Take 100 mg by mouth 2 (two) times daily.     Marland Kitchen FENUGREEK PO Take by mouth.     Marland Kitchen ibuprofen (ADVIL,MOTRIN) 600 MG tablet Take 1 tablet (600 mg total) by mouth  every 6 (six) hours as needed. (Patient not taking: No sig reported) 30 tablet 0     Review of Systems  Constitutional: Negative for chills and fever.  Eyes: Negative for visual disturbance.  Respiratory: Negative for cough and shortness of breath.   Gastrointestinal: Negative for abdominal pain, nausea and vomiting.  Genitourinary: Positive for vaginal bleeding. Negative for difficulty urinating, dysuria, pelvic pain and vaginal discharge.  Musculoskeletal: Negative for back pain.  Neurological: Negative for dizziness, light-headedness and headaches.   Physical Exam   Blood pressure 132/78, pulse 95, temperature 98.8 F (37.1 C), temperature source Oral, resp. rate 17, height 5\' 3"  (1.6 m), weight 97.2 kg, last menstrual period 05/29/2020, SpO2 100 %.  Physical Exam Vitals reviewed. Exam conducted with a chaperone present.  Constitutional:      Appearance: Normal appearance.  HENT:     Head: Normocephalic and atraumatic.  Eyes:     Conjunctiva/sclera: Conjunctivae normal.  Cardiovascular:     Rate and Rhythm: Normal rate and regular rhythm.  Pulmonary:     Effort: Pulmonary effort is normal. No respiratory distress.  Abdominal:  General: Bowel sounds are normal.     Tenderness: There is no abdominal tenderness.  Genitourinary:    Comments: Speculum Exam: -Normal External Genitalia: Non tender, no apparent discharge at introitus.  -Vaginal Vault: Pink mucosa with good rugae. Scant amt blood noted and removed with faux swab x 1 -wet prep collected -Cervix:Pink, no lesions, cysts, or polyps.  Appears closed. No active bleeding from os-GC/CT collected -Bimanual Exam:  No tenderness.  Uterine size not assessed d/t panus/body habitus.   Musculoskeletal:        General: Normal range of motion.     Cervical back: Normal range of motion.  Skin:    General: Skin is warm and dry.  Neurological:     Mental Status: She is alert and oriented to person, place, and time.   Psychiatric:        Mood and Affect: Mood normal.        Behavior: Behavior normal.        Thought Content: Thought content normal.     MAU Course  Procedures Results for orders placed or performed during the hospital encounter of 09/08/20 (from the past 24 hour(s))  Wet prep, genital     Status: Abnormal   Collection Time: 09/08/20  5:10 AM  Result Value Ref Range   Yeast Wet Prep HPF POC NONE SEEN NONE SEEN   Trich, Wet Prep NONE SEEN NONE SEEN   Clue Cells Wet Prep HPF POC NONE SEEN NONE SEEN   WBC, Wet Prep HPF POC MANY (A) NONE SEEN   Sperm NONE SEEN    US OB Comp Less 14 Wks  Result Date: 09/08/2020 CLINICAL DATA:  Spotting EXAM: OBSTETRIC <14 WK ULTRASOUND TECHNIQUE: Transabdominal ultrasound was performed for evaluation of the gestation as well as the maternal uterus and adnexal regions. COMPARISON:  Obstetrical ultrasound from prior gestation 09/03/2015 FINDINGS: Intrauterine gestational sac: Single Yolk sac:  Not Visualized. Embryo:  Visualized. Cardiac Activity: Visualized. Heart Rate: 164 bpm CRL: 57.4 mm   12 w 2 d                  [place] EDC: 03/21/2021 Subchorionic hemorrhage: Small amount of subchorionic hemorrhage seen near the implantation site (12/19) Maternal uterus/adnexae: Anteverted maternal uterus. Normal appearance of the left ovary. Right ovary not visualized. No free fluid. IMPRESSION: 1. Single intrauterine gestation at 12 weeks, 2 days by crown-rump length sonographic estimation. 2. Small volume subchorionic hemorrhage. 3. Nonvisualization of the yolk sac may be a normal physiologic finding after 10 weeks. 4. Nonvisualization the right ovary. Electronically Signed   By: Kreg Shropshire M.D.   On: 09/08/2020 05:38     MDM Pelvic Exam; Wet Prep and GC/CT Labs: UA, UPT, CBC, hCG, ABO Ultrasound Assessment and Plan  35 year old G3P2002  at 14.4 weeks Vaginal Bleeding O Positive  -Reviewed POC with patient. -Exam performed and findings discussed.  -No c/o  pain-No meds offered. -Cultures collected and pending.  -Will send for [place] and await results.    Cherre Robins 09/08/2020, 5:01 AM   Reassessment (6:08 AM) SIUP at 12.2 weeks [facility]  -Provider to bedside to discuss results. -Informed that wet prep negative. -Reviewed US findings that shows IUP at 12.2 weeks -Informed of change in EDD to Mar 21, 2021 -Educated on [facility], what to expect including bleeding, risks for miscarriage, and resolution.  -Discussed 72 hours of abstinence after each bleeding incident. -Patient requests pictures from [place], but has no other q/c. -Encouraged to  call or return to MAU if symptoms worsen or with the onset of new symptoms. -Discharged to home in stable condition.  Cherre Robins MSN, CNM Advanced Practice Provider, [facility] [employer]

## 2020-09-08 NOTE — MAU Note (Signed)
Pt presents to MAU with report of bloody show when she went to the bathroom this am around 0330.  Pt reports last intercourse was around midnight.

## 2020-09-27 ENCOUNTER — Encounter (HOSPITAL_COMMUNITY): Payer: Self-pay | Admitting: Obstetrics and Gynecology

## 2020-09-27 ENCOUNTER — Other Ambulatory Visit: Payer: Self-pay

## 2020-09-27 ENCOUNTER — Inpatient Hospital Stay (HOSPITAL_COMMUNITY)
Admission: AD | Admit: 2020-09-27 | Discharge: 2020-09-27 | Disposition: A | Discharge status: Home / Self Care | Payer: Medicaid Other | Attending: Obstetrics and Gynecology | Admitting: Obstetrics and Gynecology

## 2020-09-27 ENCOUNTER — Inpatient Hospital Stay (HOSPITAL_BASED_OUTPATIENT_CLINIC_OR_DEPARTMENT_OTHER): Payer: Medicaid Other

## 2020-09-27 DIAGNOSIS — Z3A15 15 weeks gestation of pregnancy: Secondary | ICD-10-CM | POA: Diagnosis not present

## 2020-09-27 DIAGNOSIS — O4692 Antepartum hemorrhage, unspecified, second trimester: Secondary | ICD-10-CM

## 2020-09-27 DIAGNOSIS — O322XX0 Maternal care for transverse and oblique lie, not applicable or unspecified: Secondary | ICD-10-CM

## 2020-09-27 LAB — URINALYSIS, ROUTINE W REFLEX MICROSCOPIC
Bacteria, UA: NONE SEEN
Bilirubin Urine: NEGATIVE
Glucose, UA: NEGATIVE mg/dL
Ketones, ur: 20 mg/dL — AB
Leukocytes,Ua: NEGATIVE
Nitrite: NEGATIVE
Protein, ur: NEGATIVE mg/dL
Specific Gravity, Urine: 1.018 (ref 1.005–1.030)
pH: 6 (ref 5.0–8.0)

## 2020-09-27 LAB — WET PREP, GENITAL
Clue Cells Wet Prep HPF POC: NONE SEEN
Sperm: NONE SEEN
Trich, Wet Prep: NONE SEEN
Yeast Wet Prep HPF POC: NONE SEEN

## 2020-09-27 NOTE — MAU Note (Signed)
Presents stating having pain near umbilicus and VB that began this morning.  Reports VB "isn't a lot", but occurs with wiping.  Denies recent intercourse.  Denies LOF.

## 2020-09-27 NOTE — MAU Provider Note (Signed)
History     CSN: [id number]  Arrival date and time: 09/27/20 1103   Event Date/Time   First Provider Initiated Contact with Patient 09/27/20 1222      Chief Complaint  Patient presents with  . Vaginal Bleeding  . Abdominal Pain   [name] is a 35 y.o. year old G61P2002 female at [redacted]w[redacted]d weeks gestation who presents to MAU reporting pain around her umbilicus and VB that began this AM. She states she had a "place that was separated from uterus" and "not sure if it has gone back together." She states the VB "isn't a lot. Only with wiping." She reports last SI was 3-4 days ago. She plans to start [facility] with [facility]; 1st appt with RN is 6/1 then with provider on 6/6.   OB History    Gravida  3   Para  2   Term  2   Preterm      AB      Living  2     SAB      IAB      Ectopic      Multiple  0   Live Births  2           Past Medical History:  Diagnosis Date  . Medical history non-contributory     Past Surgical History:  Procedure Laterality Date  . TONSILLECTOMY      Family History  Problem Relation Age of Onset  . Hypertension Father   . Cancer Maternal Grandfather   . Diabetes Paternal Grandmother   . Diabetes Paternal Grandfather     Social History   Tobacco Use  . Smoking status: Never Smoker  . Smokeless tobacco: Never Used  Vaping Use  . Vaping Use: Never used  Substance Use Topics  . Alcohol use: No  . Drug use: No    Allergies: No Known Allergies  Medications Prior to Admission  Medication Sig Dispense Refill Last Dose  . Prenat-Fe Poly-Methfol-FA-DHA (VITAFOL ULTRA) 29-0.6-0.4-200 MG CAPS Take 1 capsule by mouth daily before breakfast. 90 capsule 3 09/26/2020 at 1030  . docusate sodium (COLACE) 100 MG capsule Take 100 mg by mouth 2 (two) times daily.       Review of Systems  Constitutional: Negative.   HENT: Negative.   Eyes: Negative.   Respiratory: Negative.   Cardiovascular: Negative.   Gastrointestinal: Positive  for abdominal pain (around the umbilicus).  Endocrine: Negative.   Genitourinary: Positive for vaginal bleeding (with wiping).  Musculoskeletal: Negative.   Skin: Negative.   Allergic/Immunologic: Negative.   Neurological: Negative.   Hematological: Negative.   Psychiatric/Behavioral: Negative.    Physical Exam   Blood pressure 124/75, pulse 91, temperature 98.5 F (36.9 C), temperature source Oral, resp. rate 18, height 5\' 3"  (1.6 m), weight 98 kg, last menstrual period 05/29/2020, SpO2 97 %.  Physical Exam Vitals and nursing note reviewed. Exam conducted with a chaperone present.  Constitutional:      Appearance: Normal appearance. She is obese.  Cardiovascular:     Rate and Rhythm: Normal rate.  Abdominal:     Palpations: Abdomen is soft.  Genitourinary:    General: Normal vulva.     Comments: Pelvic exam: External genitalia normal, SE: vaginal walls pink and well rugated, cervix is smooth, pink, no lesions, scant amt of mucoid blood tinged vaginal d/c -- WP, GC/CT done, cervix visually closed, Uterus is non-tender, no CMT or friability, mild bilateral adnexal tenderness.  Skin:    General:  Skin is warm and dry.  Neurological:     Mental Status: She is alert and oriented to person, place, and time.  Psychiatric:        Mood and Affect: Mood normal.        Behavior: Behavior normal.        Thought Content: Thought content normal.        Judgment: Judgment normal.    MAU Course  Procedures  MDM CCUA Wet Prep GC/CT -- Results pending   Results for orders placed or performed during the hospital encounter of 09/27/20 (from the past 24 hour(s))  Urinalysis, Routine w reflex microscopic Urine, Clean Catch     Status: Abnormal   Collection Time: 09/27/20 12:00 PM  Result Value Ref Range   Color, Urine YELLOW YELLOW   APPearance HAZY (A) CLEAR   Specific Gravity, Urine 1.018 1.005 - 1.030   pH 6.0 5.0 - 8.0   Glucose, UA NEGATIVE NEGATIVE mg/dL   Hgb urine dipstick  MODERATE (A) NEGATIVE   Bilirubin Urine NEGATIVE NEGATIVE   Ketones, ur 20 (A) NEGATIVE mg/dL   Protein, ur NEGATIVE NEGATIVE mg/dL   Nitrite NEGATIVE NEGATIVE   Leukocytes,Ua NEGATIVE NEGATIVE   RBC / HPF 11-20 0 - 5 RBC/hpf   WBC, UA 0-5 0 - 5 WBC/hpf   Bacteria, UA NONE SEEN NONE SEEN   Squamous Epithelial / LPF 11-20 0 - 5   Mucus PRESENT   Wet prep, genital     Status: Abnormal   Collection Time: 09/27/20  1:23 PM   Specimen: Vaginal  Result Value Ref Range   Yeast Wet Prep HPF POC NONE SEEN NONE SEEN   Trich, Wet Prep NONE SEEN NONE SEEN   Clue Cells Wet Prep HPF POC NONE SEEN NONE SEEN   WBC, Wet Prep HPF POC FEW (A) NONE SEEN   Sperm NONE SEEN     Assessment and Plan  Vaginal bleeding in pregnancy, second trimester  - Information provided on vaginal bleeding in pregnancy  - Return to MAU:  If you have heavier bleeding that soaks through more that 2 pads per hour for an hour or more  If you bleed so much that you feel like you might pass out or you do pass out  If you have significant abdominal pain that is not improved with Tylenol 1000 mg every 6 hours as needed for pain  If you develop a fever > 100.5  [redacted] weeks gestation of pregnancy  - Discharge patient - Keep scheduled appts with [facility] - Patient verbalized an understanding of the plan of care and agrees.     Raelyn Mora, CNM 09/27/2020, 12:24 PM

## 2020-09-27 NOTE — Discharge Instructions (Signed)
Return to MAU:  If you have heavier bleeding that soaks through more that 2 pads per hour for an hour or more  If you bleed so much that you feel like you might pass out or you do pass out  If you have significant abdominal pain that is not improved with Tylenol 1000 mg every 6 hours as needed for pain  If you develop a fever > 100.5   

## 2020-09-29 ENCOUNTER — Telehealth: Payer: Self-pay

## 2020-09-29 LAB — GC/CHLAMYDIA PROBE AMP (~~LOC~~) NOT AT ARMC
Chlamydia: NEGATIVE
Comment: NEGATIVE
Comment: NORMAL
Neisseria Gonorrhea: NEGATIVE

## 2020-09-29 NOTE — Telephone Encounter (Signed)
TC from patient. She reported some light bleeding this morning after she wiped. She denies any pain at this time but is concerned. I have advised patient to continued to monitor her bleeding. If she starts to soak 1-2 pads per hour she should report back to ER to be check. I have advised her to call us back later for guidance if needed. Patient voice understanding.

## 2020-10-07 DIAGNOSIS — Z3480 Encounter for supervision of other normal pregnancy, unspecified trimester: Secondary | ICD-10-CM | POA: Insufficient documentation

## 2020-10-08 ENCOUNTER — Ambulatory Visit (INDEPENDENT_AMBULATORY_CARE_PROVIDER_SITE_OTHER): Payer: Medicaid Other

## 2020-10-08 DIAGNOSIS — Z3A00 Weeks of gestation of pregnancy not specified: Secondary | ICD-10-CM

## 2020-10-08 DIAGNOSIS — Z3480 Encounter for supervision of other normal pregnancy, unspecified trimester: Secondary | ICD-10-CM

## 2020-10-08 MED ORDER — BLOOD PRESSURE KIT DEVI: 1.0000 | 0 refills | Status: DC

## 2020-10-08 MED ORDER — GOJJI WEIGHT SCALE MISC: 1.0000 | 0 refills | Status: DC

## 2020-10-08 NOTE — Progress Notes (Addendum)
New OB Intake  I connected with  Debra Wyatt on 10/08/20 at  2:00 PM EDT by telephone and verified that I am speaking with the correct person using two identifiers. Nurse is located at [facility] and pt is located at HOME.  I discussed the limitations, risks, security and privacy concerns of performing an evaluation and management service by telephone and the availability of in person appointments. I also discussed with the patient that there may be a patient responsible charge related to this service. The patient expressed understanding and agreed to proceed.  I explained I am completing New OB Intake today. We discussed her EDD of 03/21/2021 that is based on LMP of 05/29/2020. Pt is G3/P2.  I reviewed her allergies, medications, Medical/Surgical/OB history, and appropriate screenings. I informed her of [facility] services. Based on history, this is a/an uncomplicated pregnancy.  Patient Active Problem List   Diagnosis Date Noted  . Supervision of other normal pregnancy, antepartum 10/07/2020  . Labor and delivery indication for care or intervention 01/23/2016  . Supervision of normal subsequent pregnancy 01/21/2016    Concerns addressed today  Delivery Plans:  Plans to deliver at [facility].   MyChart/Babyscripts MyChart access verified. I explained pt will have some visits in office and some virtually. Babyscripts instructions given and order placed. Patient verifies receipt of registration text/e-mail. Account successfully created and app downloaded.  Blood Pressure Cuff Blood pressure cuff ordered for patient to pick-up from [employer]. Explained after first prenatal appt pt will check weekly and document in Babyscripts.  Anatomy US Explained first scheduled [place] will be around 19 weeks. Anatomy US will be scheduled at NOB visit.   Labs Discussed Avelina Laine genetic screening with patient. Would like both Panorama and Horizon drawn at new OB visit. Routine prenatal labs needed.  Covid  Vaccine Patient has not covid vaccine.   Social Determinants of Health . Food Insecurity: Patient denies food insecurity. . WIC Referral: Patient is not interested in referral to [facility].  . Transportation: Patient denies transportation needs. . Childcare: Discussed no children allowed at ultrasound appointments. Offered childcare services; patient declines childcare services at this time.  First visit review I reviewed new OB appt with pt. I explained she will have a pelvic exam, ob bloodwork with genetic screening, and PAP smear. Explained pt will be seen by Dr. Coral Ceo at first visit on 10/13/2020; encounter routed to appropriate provider. Explained that patient will be seen by pregnancy navigator following visit with provider.  PHQ-9=2 GAD-7=1  Maretta Bees, RMA 10/08/2020  2:31 PM   Patient was assessed and managed by nursing staff during this encounter. I have reviewed the chart and agree with the documentation and plan. I have also made any necessary editorial changes.  Coral Ceo, MD 10/08/2020 4:58 PM

## 2020-10-10 ENCOUNTER — Telehealth: Payer: Self-pay | Admitting: *Deleted

## 2020-10-10 NOTE — Telephone Encounter (Signed)
Pt called to office for OTC meds she may use for cough. Pt made aware of OTC meds she may take in pregnancy.

## 2020-10-13 ENCOUNTER — Other Ambulatory Visit (HOSPITAL_COMMUNITY)
Admission: RE | Admit: 2020-10-13 | Discharge: 2020-10-13 | Disposition: A | Discharge status: Home / Self Care | Payer: Medicaid Other | Source: Ambulatory Visit | Attending: Obstetrics | Admitting: Obstetrics

## 2020-10-13 ENCOUNTER — Encounter: Payer: Self-pay | Admitting: Obstetrics

## 2020-10-13 ENCOUNTER — Encounter: Payer: Medicaid Other | Admitting: Obstetrics

## 2020-10-13 ENCOUNTER — Ambulatory Visit (INDEPENDENT_AMBULATORY_CARE_PROVIDER_SITE_OTHER): Payer: Medicaid Other | Admitting: Obstetrics

## 2020-10-13 ENCOUNTER — Other Ambulatory Visit: Payer: Self-pay

## 2020-10-13 VITALS — BP 133/88 | HR 86 | Wt 220.0 lb

## 2020-10-13 DIAGNOSIS — Z3480 Encounter for supervision of other normal pregnancy, unspecified trimester: Secondary | ICD-10-CM | POA: Diagnosis not present

## 2020-10-13 DIAGNOSIS — O99212 Obesity complicating pregnancy, second trimester: Secondary | ICD-10-CM

## 2020-10-13 DIAGNOSIS — Z3A17 17 weeks gestation of pregnancy: Secondary | ICD-10-CM

## 2020-10-13 DIAGNOSIS — O99612 Diseases of the digestive system complicating pregnancy, second trimester: Secondary | ICD-10-CM

## 2020-10-13 DIAGNOSIS — Z3482 Encounter for supervision of other normal pregnancy, second trimester: Secondary | ICD-10-CM | POA: Diagnosis not present

## 2020-10-13 DIAGNOSIS — O99210 Obesity complicating pregnancy, unspecified trimester: Secondary | ICD-10-CM

## 2020-10-13 DIAGNOSIS — K5900 Constipation, unspecified: Secondary | ICD-10-CM

## 2020-10-13 DIAGNOSIS — O99619 Diseases of the digestive system complicating pregnancy, unspecified trimester: Secondary | ICD-10-CM

## 2020-10-13 MED ORDER — DOCUSATE SODIUM 100 MG PO CAPS
100.0000 mg | ORAL_CAPSULE | Freq: Two times a day (BID) | ORAL | 11 refills | Status: DC
Start: 2020-10-13 — End: 2020-11-11

## 2020-10-13 MED ORDER — VITAFOL ULTRA 29-0.6-0.4-200 MG PO CAPS: 1.0000 | ORAL_CAPSULE | Freq: Every day | ORAL | 3 refills | Status: DC

## 2020-10-13 NOTE — Progress Notes (Signed)
Patient presents for New OB. Patient has no concerns today.  °

## 2020-10-13 NOTE — Progress Notes (Signed)
Subjective:    Debra Wyatt is being seen today for her first obstetrical visit.  This is not a planned pregnancy. She is at [redacted]w[redacted]d gestation. Her obstetrical history is significant for obesity. Relationship with FOB: significant other, supportive. Patient does intend to breast feed. Pregnancy history fully reviewed.  The information documented in the HPI was reviewed and verified.  Menstrual History: OB History    Gravida  3   Para  2   Term  2   Preterm      AB      Living  2     SAB      IAB      Ectopic      Multiple  0   Live Births  2            Patient's last menstrual period was 05/29/2020.    Past Medical History:  Diagnosis Date  . Headache   . Medical history non-contributory     Past Surgical History:  Procedure Laterality Date  . TONSILLECTOMY    . WISDOM TOOTH EXTRACTION      (Not in a hospital admission)  No Known Allergies  Social History   Tobacco Use  . Smoking status: Never Smoker  . Smokeless tobacco: Never Used  Substance Use Topics  . Alcohol use: Not Currently    Family History  Problem Relation Age of Onset  . Hypertension Father   . Cancer Maternal Grandfather   . Diabetes Paternal Grandmother   . Diabetes Paternal Grandfather   . Asthma Brother      Review of Systems Constitutional: negative for weight loss Gastrointestinal: negative for vomiting Genitourinary:negative for genital lesions and vaginal discharge and dysuria Musculoskeletal:negative for back pain Behavioral/Psych: negative for abusive relationship, depression, illegal drug usage and tobacco use    Objective:    BP 133/88   Pulse 86   Wt 220 lb (99.8 kg)   LMP 05/29/2020   BMI 38.97 kg/m  General Appearance:    Alert, cooperative, no distress, appears stated age  Head:    Normocephalic, without obvious abnormality, atraumatic  Eyes:    PERRL, conjunctiva/corneas clear, EOM's intact, fundi    benign, both eyes  Ears:    Normal TM's and  external ear canals, both ears  Nose:   Nares normal, septum midline, mucosa normal, no drainage    or sinus tenderness  Throat:   Lips, mucosa, and tongue normal; teeth and gums normal  Neck:   Supple, symmetrical, trachea midline, no adenopathy;    thyroid:  no enlargement/tenderness/nodules; no carotid   bruit or JVD  Back:     Symmetric, no curvature, ROM normal, no CVA tenderness  Lungs:     Clear to auscultation bilaterally, respirations unlabored  Chest Wall:    No tenderness or deformity   Heart:    Regular rate and rhythm, S1 and S2 normal, no murmur, rub   or gallop  Breast Exam:    No tenderness, masses, or nipple abnormality  Abdomen:     Soft, non-tender, bowel sounds active all four quadrants,    no masses, no organomegaly  Genitalia:    Normal female without lesion, discharge or tenderness  Extremities:   Extremities normal, atraumatic, no cyanosis or edema  Pulses:   2+ and symmetric all extremities  Skin:   Skin color, texture, turgor normal, no rashes or lesions  Lymph nodes:   Cervical, supraclavicular, and axillary nodes normal  Neurologic:   CNII-XII intact, normal  strength, sensation and reflexes    throughout      Lab Review Urine pregnancy test Labs reviewed yes Radiologic studies reviewed yes  Assessment:    Pregnancy at [redacted]w[redacted]d weeks    Plan:     1. Supervision of other normal pregnancy, antepartum Rx: - Culture, OB Urine - Genetic Screening - AFP, Serum, Open Spina Bifida - Cervicovaginal ancillary only( [facility]) - Hepatitis C Antibody - Obstetric Panel, Including HIV - [place] MFM OB DETAIL +14 WK; Future - Prenat-Fe Poly-Methfol-FA-DHA (VITAFOL ULTRA) 29-0.6-0.4-200 MG CAPS; Take 1 capsule by mouth daily before breakfast.  Dispense: 90 capsule; Refill: 3  2. Obesity affecting pregnancy, antepartum  3. Constipation during pregnancy, antepartum Rx: - docusate sodium (COLACE) 100 MG capsule; Take 1 capsule (100 mg total) by mouth 2 (two) times  daily.  Dispense: 60 capsule; Refill: 11    Prenatal vitamins.  Counseling provided regarding continued use of seat belts, cessation of alcohol consumption, smoking or use of illicit drugs; infection precautions i.e., influenza/TDAP immunizations, toxoplasmosis,CMV, parvovirus, listeria and varicella; workplace safety, exercise during pregnancy; routine dental care, safe medications, sexual activity, hot tubs, saunas, pools, travel, caffeine use, fish and methlymercury, potential toxins, hair treatments, varicose veins Weight gain recommendations per IOM guidelines reviewed: underweight/BMI< 18.5--> gain 28 - 40 lbs; normal weight/BMI 18.5 - 24.9--> gain 25 - 35 lbs; overweight/BMI 25 - 29.9--> gain 15 - 25 lbs; obese/BMI >30->gain  11 - 20 lbs Problem list reviewed and updated. FIRST/CF mutation testing/NIPT/QUAD SCREEN/fragile X/Ashkenazi Jewish population testing/Spinal muscular atrophy discussed: requested. Role of ultrasound in pregnancy discussed; fetal survey: requested. Amniocentesis discussed: not indicated.   Meds ordered this encounter  Medications  . Prenat-Fe Poly-Methfol-FA-DHA (VITAFOL ULTRA) 29-0.6-0.4-200 MG CAPS    Sig: Take 1 capsule by mouth daily before breakfast.    Dispense:  90 capsule    Refill:  3  . docusate sodium (COLACE) 100 MG capsule    Sig: Take 1 capsule (100 mg total) by mouth 2 (two) times daily.    Dispense:  60 capsule    Refill:  11   Orders Placed This Encounter  Procedures  . Culture, OB Urine  . [place] MFM OB DETAIL +14 WK    Standing Status:   Future    Standing Expiration Date:   10/13/2021    Order Specific Question:   Reason for Exam (SYMPTOM  OR DIAGNOSIS REQUIRED)    Answer:   Anatomy    Order Specific Question:   Preferred Location    Answer:   [facility] Ul[facility]ound  . Genetic Screening  . AFP, Serum, Open Spina Bifida    Order Specific Question:   Is patient insulin dependent?    Answer:   No    Order Specific Question:   Gestational Age  (GA), weeks    Answer:   60    Order Specific Question:   Date on which patient was at this GA    Answer:   10/13/2020    Order Specific Question:   GA Calculation Method    Answer:   LMP    Order Specific Question:   Number of fetuses    Answer:   1  . Hepatitis C Antibody  . Obstetric Panel, Including HIV    Follow up in 4 weeks.  I have spent a total of 20 minutes of face-to-face time, excluding clinical staff time, reviewing notes and preparing to see patient, ordering tests and/or medications, and counseling the patient.   Coral Ceo  A, MD 10/13/2020 11:58 AM

## 2020-10-14 ENCOUNTER — Other Ambulatory Visit: Payer: Self-pay | Admitting: Obstetrics

## 2020-10-14 ENCOUNTER — Telehealth: Payer: Self-pay

## 2020-10-14 DIAGNOSIS — N760 Acute vaginitis: Secondary | ICD-10-CM

## 2020-10-14 DIAGNOSIS — B379 Candidiasis, unspecified: Secondary | ICD-10-CM

## 2020-10-14 DIAGNOSIS — B9689 Other specified bacterial agents as the cause of diseases classified elsewhere: Secondary | ICD-10-CM

## 2020-10-14 LAB — CERVICOVAGINAL ANCILLARY ONLY
Bacterial Vaginitis (gardnerella): POSITIVE — AB
Candida Glabrata: NEGATIVE
Candida Vaginitis: POSITIVE — AB
Chlamydia: NEGATIVE
Comment: NEGATIVE
Comment: NEGATIVE
Comment: NEGATIVE
Comment: NEGATIVE
Comment: NEGATIVE
Comment: NORMAL
Neisseria Gonorrhea: NEGATIVE
Trichomonas: NEGATIVE

## 2020-10-14 MED ORDER — METRONIDAZOLE 500 MG PO TABS: 500.0000 mg | ORAL_TABLET | Freq: Two times a day (BID) | ORAL | 2 refills | Status: DC

## 2020-10-14 MED ORDER — TERCONAZOLE 0.4 % VA CREA: 1.0000 | TOPICAL_CREAM | Freq: Every day | VAGINAL | 0 refills | Status: DC

## 2020-10-14 NOTE — Telephone Encounter (Signed)
Call patient to inform her of test results and recommendations. 

## 2020-10-14 NOTE — Telephone Encounter (Signed)
-----   Message from Brock Bad, MD sent at 10/14/2020  3:45 PM EDT ----- Flagyl Rx for BV Terazol 7 Rx for yeast

## 2020-10-15 LAB — OBSTETRIC PANEL, INCLUDING HIV
Antibody Screen: NEGATIVE
Basophils Absolute: 0 10*3/uL (ref 0.0–0.2)
Basos: 0 %
EOS (ABSOLUTE): 0.1 10*3/uL (ref 0.0–0.4)
Eos: 1 %
HIV Screen 4th Generation wRfx: NONREACTIVE
Hematocrit: 38.7 % (ref 34.0–46.6)
Hemoglobin: 12.3 g/dL (ref 11.1–15.9)
Hepatitis B Surface Ag: NEGATIVE
Immature Grans (Abs): 0.1 10*3/uL (ref 0.0–0.1)
Immature Granulocytes: 1 %
Lymphocytes Absolute: 2 10*3/uL (ref 0.7–3.1)
Lymphs: 23 %
MCH: 27.6 pg (ref 26.6–33.0)
MCHC: 31.8 g/dL (ref 31.5–35.7)
MCV: 87 fL (ref 79–97)
Monocytes Absolute: 0.5 10*3/uL (ref 0.1–0.9)
Monocytes: 6 %
Neutrophils Absolute: 6.2 10*3/uL (ref 1.4–7.0)
Neutrophils: 69 %
Platelets: 237 10*3/uL (ref 150–450)
RBC: 4.46 x10E6/uL (ref 3.77–5.28)
RDW: 12.9 % (ref 11.7–15.4)
RPR Ser Ql: NONREACTIVE
Rh Factor: POSITIVE
Rubella Antibodies, IGG: 1.34 index (ref >0.99)
WBC: 8.8 10*3/uL (ref 3.4–10.8)

## 2020-10-15 LAB — AFP, SERUM, OPEN SPINA BIFIDA
AFP MoM: 0.82
AFP Value: 26 ng/mL
Gest. Age on Collection Date: 17 weeks
Maternal Age At EDD: 33 yr
OSBR Risk 1 IN: 10000
Test Results:: NEGATIVE
Weight: 220 [lb_av]

## 2020-10-15 LAB — HEPATITIS C ANTIBODY: Hep C Virus Ab: <0.1 s/co ratio (ref 0.0–0.9)

## 2020-10-15 LAB — URINE CULTURE, OB REFLEX

## 2020-10-15 LAB — CULTURE, OB URINE

## 2020-10-20 ENCOUNTER — Encounter: Payer: Self-pay | Admitting: Obstetrics

## 2020-10-24 ENCOUNTER — Encounter: Payer: Self-pay | Admitting: Obstetrics

## 2020-11-05 ENCOUNTER — Other Ambulatory Visit: Payer: Self-pay

## 2020-11-05 ENCOUNTER — Other Ambulatory Visit: Payer: Self-pay | Admitting: *Deleted

## 2020-11-05 ENCOUNTER — Ambulatory Visit: Payer: Medicaid Other | Attending: Obstetrics

## 2020-11-05 ENCOUNTER — Ambulatory Visit: Payer: Medicaid Other | Admitting: *Deleted

## 2020-11-05 VITALS — BP 129/77 | HR 105

## 2020-11-05 DIAGNOSIS — Z3480 Encounter for supervision of other normal pregnancy, unspecified trimester: Secondary | ICD-10-CM

## 2020-11-05 DIAGNOSIS — O99212 Obesity complicating pregnancy, second trimester: Secondary | ICD-10-CM | POA: Insufficient documentation

## 2020-11-05 DIAGNOSIS — Z363 Encounter for antenatal screening for malformations: Secondary | ICD-10-CM | POA: Diagnosis present

## 2020-11-05 DIAGNOSIS — Z3A20 20 weeks gestation of pregnancy: Secondary | ICD-10-CM | POA: Diagnosis not present

## 2020-11-05 DIAGNOSIS — O43192 Other malformation of placenta, second trimester: Secondary | ICD-10-CM | POA: Diagnosis not present

## 2020-11-05 DIAGNOSIS — Z3482 Encounter for supervision of other normal pregnancy, second trimester: Secondary | ICD-10-CM

## 2020-11-05 DIAGNOSIS — O43199 Other malformation of placenta, unspecified trimester: Secondary | ICD-10-CM

## 2020-11-11 ENCOUNTER — Other Ambulatory Visit: Payer: Self-pay

## 2020-11-11 ENCOUNTER — Encounter: Payer: Self-pay | Admitting: Obstetrics

## 2020-11-11 ENCOUNTER — Ambulatory Visit (INDEPENDENT_AMBULATORY_CARE_PROVIDER_SITE_OTHER): Payer: Medicaid Other | Admitting: Obstetrics

## 2020-11-11 VITALS — BP 129/79 | HR 92 | Wt 218.8 lb

## 2020-11-11 DIAGNOSIS — O99210 Obesity complicating pregnancy, unspecified trimester: Secondary | ICD-10-CM

## 2020-11-11 DIAGNOSIS — Z3A21 21 weeks gestation of pregnancy: Secondary | ICD-10-CM

## 2020-11-11 DIAGNOSIS — O43199 Other malformation of placenta, unspecified trimester: Secondary | ICD-10-CM

## 2020-11-11 DIAGNOSIS — Z3480 Encounter for supervision of other normal pregnancy, unspecified trimester: Secondary | ICD-10-CM

## 2020-11-11 NOTE — Progress Notes (Signed)
    Patient in clinic for ROB. She would like to discuss her ultrasound results.  Clovis Pu, RN

## 2020-11-11 NOTE — Progress Notes (Signed)
Subjective:  Debra Wyatt is a 35 y.o. G3P2002 at [redacted]w[redacted]d being seen today for ongoing prenatal care.  She is currently monitored for the following issues for this low-risk pregnancy and has Supervision of normal subsequent pregnancy; Labor and delivery indication for care or intervention; and Supervision of other normal pregnancy, antepartum on their problem list.  Patient reports no complaints.  Contractions: Not present. Vag. Bleeding: None.  Movement: Present. Denies leaking of fluid.   The following portions of the patient's history were reviewed and updated as appropriate: allergies, current medications, past family history, past medical history, past social history, past surgical history and problem list. Problem list updated.  Objective:   Vitals:   11/11/20 0858  BP: 129/79  Pulse: 92  Weight: 218 lb 12.8 oz (99.2 kg)    Fetal Status: Fetal Heart Rate (bpm): 154   Movement: Present     General:  Alert, oriented and cooperative. Patient is in no acute distress.  Skin: Skin is warm and dry. No rash noted.   Cardiovascular: Normal heart rate noted  Respiratory: Normal respiratory effort, no problems with respiration noted  Abdomen: Soft, gravid, appropriate for gestational age. Pain/Pressure: Absent     Pelvic:  Cervical exam deferred        Extremities: Normal range of motion.  Edema: None  Mental Status: Normal mood and affect. Normal behavior. Normal judgment and thought content.   Urinalysis:      Assessment and Plan:  Pregnancy: G3P2002 at [redacted]w[redacted]d  1. Supervision of other normal pregnancy, antepartum  2. Obesity affecting pregnancy, antepartum  3. Marginal insertion of umbilical cord affecting management of mother - ultrasound for interval growth every 3-4 weeks    Preterm labor symptoms and general obstetric precautions including but not limited to vaginal bleeding, contractions, leaking of fluid and fetal movement were reviewed in detail with the  patient. Please refer to After Visit Summary for other counseling recommendations.   Return in about 4 weeks (around 12/09/2020) for ROB.   Brock Bad, MD  11/11/20

## 2020-12-03 ENCOUNTER — Ambulatory Visit: Payer: Medicaid Other | Attending: Obstetrics and Gynecology

## 2020-12-03 ENCOUNTER — Encounter: Payer: Self-pay | Admitting: *Deleted

## 2020-12-03 ENCOUNTER — Other Ambulatory Visit: Payer: Self-pay | Admitting: *Deleted

## 2020-12-03 ENCOUNTER — Ambulatory Visit: Payer: Medicaid Other | Admitting: *Deleted

## 2020-12-03 ENCOUNTER — Other Ambulatory Visit: Payer: Self-pay

## 2020-12-03 VITALS — BP 130/58 | HR 103

## 2020-12-03 DIAGNOSIS — Z3A24 24 weeks gestation of pregnancy: Secondary | ICD-10-CM | POA: Diagnosis not present

## 2020-12-03 DIAGNOSIS — Z363 Encounter for antenatal screening for malformations: Secondary | ICD-10-CM | POA: Insufficient documentation

## 2020-12-03 DIAGNOSIS — Z3480 Encounter for supervision of other normal pregnancy, unspecified trimester: Secondary | ICD-10-CM

## 2020-12-03 DIAGNOSIS — Z362 Encounter for other antenatal screening follow-up: Secondary | ICD-10-CM

## 2020-12-03 DIAGNOSIS — O43122 Velamentous insertion of umbilical cord, second trimester: Secondary | ICD-10-CM | POA: Insufficient documentation

## 2020-12-03 DIAGNOSIS — O43199 Other malformation of placenta, unspecified trimester: Secondary | ICD-10-CM

## 2020-12-03 DIAGNOSIS — O99212 Obesity complicating pregnancy, second trimester: Secondary | ICD-10-CM | POA: Insufficient documentation

## 2020-12-03 DIAGNOSIS — O43192 Other malformation of placenta, second trimester: Secondary | ICD-10-CM | POA: Diagnosis not present

## 2020-12-03 DIAGNOSIS — O321XX0 Maternal care for breech presentation, not applicable or unspecified: Secondary | ICD-10-CM

## 2020-12-03 DIAGNOSIS — E669 Obesity, unspecified: Secondary | ICD-10-CM

## 2020-12-09 ENCOUNTER — Ambulatory Visit (INDEPENDENT_AMBULATORY_CARE_PROVIDER_SITE_OTHER): Payer: Medicaid Other | Admitting: Family Medicine

## 2020-12-09 ENCOUNTER — Other Ambulatory Visit: Payer: Self-pay

## 2020-12-09 ENCOUNTER — Encounter: Payer: Self-pay | Admitting: Family Medicine

## 2020-12-09 VITALS — BP 124/74 | HR 101 | Wt 217.0 lb

## 2020-12-09 DIAGNOSIS — O99210 Obesity complicating pregnancy, unspecified trimester: Secondary | ICD-10-CM

## 2020-12-09 DIAGNOSIS — Z3480 Encounter for supervision of other normal pregnancy, unspecified trimester: Secondary | ICD-10-CM

## 2020-12-09 DIAGNOSIS — O43199 Other malformation of placenta, unspecified trimester: Secondary | ICD-10-CM

## 2020-12-09 DIAGNOSIS — Z3A25 25 weeks gestation of pregnancy: Secondary | ICD-10-CM

## 2020-12-09 NOTE — Progress Notes (Signed)
   PRENATAL VISIT NOTE  Subjective:  Debra Wyatt is a 35 y.o. G3P2002 at [redacted]w[redacted]d being seen today for ongoing prenatal care.  She is currently monitored for the following issues for this low-risk pregnancy and has Supervision of other normal pregnancy, antepartum; Obesity affecting pregnancy, antepartum; and Marginal insertion of umbilical cord affecting management of mother on their problem list.  Patient reports she is doing well today.  Contractions: Not present. Vag. Bleeding: None.  Movement: Present. Denies leaking of fluid. She has had some difficulty sleeping due to being uncomfortable at times. Using pillows and changing positions often to help with this. Wondering what else she can do for lower back pain at home.  The following portions of the patient's history were reviewed and updated as appropriate: allergies, current medications, past family history, past medical history, past social history, past surgical history and problem list.   Objective:   Vitals:   12/09/20 1119  BP: 124/74  Pulse: (!) 101  Weight: 217 lb (98.4 kg)    Fetal Status: Fetal Heart Rate (bpm): 152 Fundal Height: 25 cm Movement: Present     General:  Alert, oriented and cooperative. Patient is in no acute distress.  Skin: Skin is warm and dry. No rash noted.   Cardiovascular: Normal heart rate noted, normal rhythm, no murmurs.   Respiratory: Normal respiratory effort.  Abdomen: Soft, gravid, appropriate for gestational age.  Pain/Pressure: Absent     Pelvic: Cervical exam deferred.        Extremities: Normal range of motion.  Edema: None  Mental Status: Normal mood and affect. Normal behavior. Normal judgment and thought content.   Assessment and Plan:  Pregnancy: G3P2002 at [redacted]w[redacted]d  1. Supervision of other normal pregnancy, antepartum 2. [redacted] weeks gestation of pregnancy Progressing as expected. Recommended low back massage and heating pad as needed for low back pain. Additionally counseled on low  back stretching exercises that patient can perform to help her symptoms. Due for follow up prenatal visit in 3-4 weeks. Will need 28 week labs and Tdap next visit.  3. Obesity affecting pregnancy, antepartum  4. Marginal insertion of umbilical cord affecting management of mother Last ultrasound on 7/27 with normal interval growth. Follow up scheduled for 8/31.  Preterm labor symptoms and general obstetric precautions including but not limited to vaginal bleeding, contractions, leaking of fluid and fetal movement were reviewed in detail with the patient.  Please refer to After Visit Summary for other counseling recommendations.   Return in 3 weeks (on 12/30/2020) for follow up OB visit.  Future Appointments  Date Time Provider Department Center  12/30/2020  9:15 AM [facility] None  12/30/2020  9:35 AM Constant, Gigi Gin, MD [facility] None  01/07/2021 10:45 AM [facility] NURSE [facility]  01/07/2021 11:00 AM [facility] US1 [facility]    Worthy Rancher, MD Obstetrics Fellow 12/09/20 12:08 PM

## 2020-12-09 NOTE — Patient Instructions (Signed)

## 2020-12-09 NOTE — Progress Notes (Signed)
+   Fetal movement. No complaints.  

## 2020-12-30 ENCOUNTER — Ambulatory Visit (INDEPENDENT_AMBULATORY_CARE_PROVIDER_SITE_OTHER): Payer: Medicaid Other | Admitting: Obstetrics and Gynecology

## 2020-12-30 ENCOUNTER — Other Ambulatory Visit: Payer: Medicaid Other

## 2020-12-30 ENCOUNTER — Other Ambulatory Visit: Payer: Self-pay

## 2020-12-30 ENCOUNTER — Encounter: Payer: Self-pay | Admitting: Obstetrics and Gynecology

## 2020-12-30 VITALS — BP 104/69 | HR 87 | Wt 221.0 lb

## 2020-12-30 DIAGNOSIS — Z3480 Encounter for supervision of other normal pregnancy, unspecified trimester: Secondary | ICD-10-CM

## 2020-12-30 DIAGNOSIS — Z23 Encounter for immunization: Secondary | ICD-10-CM | POA: Diagnosis not present

## 2020-12-30 DIAGNOSIS — O43199 Other malformation of placenta, unspecified trimester: Secondary | ICD-10-CM

## 2020-12-30 DIAGNOSIS — O24419 Gestational diabetes mellitus in pregnancy, unspecified control: Secondary | ICD-10-CM

## 2020-12-30 DIAGNOSIS — O99210 Obesity complicating pregnancy, unspecified trimester: Secondary | ICD-10-CM

## 2020-12-30 MED ORDER — COMFORT FIT MATERNITY SUPP LG MISC: 0 refills | Status: DC

## 2020-12-30 NOTE — Progress Notes (Signed)
   PRENATAL VISIT NOTE  Subjective:  Debra Wyatt is a 35 y.o. G3P2002 at [redacted]w[redacted]d being seen today for ongoing prenatal care.  She is currently monitored for the following issues for this low-risk pregnancy and has Supervision of other normal pregnancy, antepartum; Obesity affecting pregnancy, antepartum; and Marginal insertion of umbilical cord affecting management of mother on their problem list.  Patient reports no complaints.  Contractions: Not present. Vag. Bleeding: None.  Movement: Present. Denies leaking of fluid.   The following portions of the patient's history were reviewed and updated as appropriate: allergies, current medications, past family history, past medical history, past social history, past surgical history and problem list.   Objective:   Vitals:   12/30/20 0942  BP: 104/69  Pulse: 87  Weight: 221 lb (100.2 kg)    Fetal Status: Fetal Heart Rate (bpm): 147 Fundal Height: 29 cm Movement: Present     General:  Alert, oriented and cooperative. Patient is in no acute distress.  Skin: Skin is warm and dry. No rash noted.   Cardiovascular: Normal heart rate noted  Respiratory: Normal respiratory effort, no problems with respiration noted  Abdomen: Soft, gravid, appropriate for gestational age.  Pain/Pressure: Present     Pelvic: Cervical exam deferred        Extremities: Normal range of motion.  Edema: None  Mental Status: Normal mood and affect. Normal behavior. Normal judgment and thought content.   Assessment and Plan:  Pregnancy: G3P2002 at [redacted]w[redacted]d 1. Supervision of other normal pregnancy, antepartum Patient is doing well without complaints Third trimester labs today Patient declined tdap Patient undecided on contraception Patient with hip pain and pelvic pressure with prolonged walking- Rx maternity support belt provided - Glucose Tolerance, 2 Hours w/1 Hour - RPR - HIV antibody (with reflex) - CBC  2. Obesity affecting pregnancy, antepartum   3.  Marginal insertion of umbilical cord affecting management of mother Follow up growth ultrasound on 8/31  Preterm labor symptoms and general obstetric precautions including but not limited to vaginal bleeding, contractions, leaking of fluid and fetal movement were reviewed in detail with the patient. Please refer to After Visit Summary for other counseling recommendations.   Return in about 2 weeks (around 01/13/2021) for in person, ROB, Low risk.  Future Appointments  Date Time Provider Department Center  01/07/2021 10:45 AM [facility] NURSE [facility]  01/07/2021 11:00 AM [facility] US1 [facility]    Catalina Antigua, MD

## 2020-12-30 NOTE — Progress Notes (Signed)
ROB/GTT.  Declined TDAP vaccine. Reports no problems today. 

## 2020-12-30 NOTE — Addendum Note (Signed)
Addended by: Charlsie Quest B on: 12/30/2020 10:45 AM   Modules accepted: Orders

## 2020-12-31 ENCOUNTER — Other Ambulatory Visit: Payer: Self-pay

## 2020-12-31 DIAGNOSIS — O24419 Gestational diabetes mellitus in pregnancy, unspecified control: Secondary | ICD-10-CM | POA: Insufficient documentation

## 2020-12-31 LAB — GLUCOSE TOLERANCE, 2 HOURS W/ 1HR
Glucose, 1 hour: 171 mg/dL (ref 65–179)
Glucose, 2 hour: 126 mg/dL (ref 65–152)
Glucose, Fasting: 92 mg/dL — ABNORMAL HIGH (ref 65–91)

## 2020-12-31 LAB — CBC
Hematocrit: 34 % (ref 34.0–46.6)
Hemoglobin: 11.4 g/dL (ref 11.1–15.9)
MCH: 27.9 pg (ref 26.6–33.0)
MCHC: 33.5 g/dL (ref 31.5–35.7)
MCV: 83 fL (ref 79–97)
Platelets: 220 10*3/uL (ref 150–450)
RBC: 4.08 x10E6/uL (ref 3.77–5.28)
RDW: 12.7 % (ref 11.7–15.4)
WBC: 9.4 10*3/uL (ref 3.4–10.8)

## 2020-12-31 LAB — HIV ANTIBODY (ROUTINE TESTING W REFLEX): HIV Screen 4th Generation wRfx: NONREACTIVE

## 2020-12-31 LAB — RPR: RPR Ser Ql: NONREACTIVE

## 2020-12-31 MED ORDER — ACCU-CHEK SOFTCLIX LANCETS MISC: 1.0000 | Freq: Four times a day (QID) | 12 refills | Status: DC

## 2020-12-31 MED ORDER — ACCU-CHEK GUIDE W/DEVICE KIT: 1.0000 | PACK | Freq: Four times a day (QID) | 0 refills | Status: DC

## 2020-12-31 MED ORDER — ACCU-CHEK GUIDE VI STRP: ORAL_STRIP | 12 refills | Status: DC

## 2020-12-31 NOTE — Progress Notes (Signed)
GDM supplies sent to pt pharmacy as advised referral placed already by provider will contact pt to make aware.

## 2020-12-31 NOTE — Addendum Note (Signed)
Addended by: Catalina Antigua on: 12/31/2020 07:47 AM   Modules accepted: Orders

## 2021-01-05 NOTE — Progress Notes (Signed)
Called patient using [employer]. No answer. Spanish Interpreter left message for patient to call the office.

## 2021-01-05 NOTE — Progress Notes (Signed)
Called patient using [employer]. No answer. Interpreter left message for patient to call the office.

## 2021-01-06 ENCOUNTER — Encounter: Payer: Self-pay | Admitting: *Deleted

## 2021-01-06 NOTE — Progress Notes (Signed)
Second call to patient to inform of GDM diagnosis, need to pick up testing supplies and referral to diabetes educator. No answer. Patient is active in MyChart. Message sent.

## 2021-01-06 NOTE — Telephone Encounter (Signed)
I spoke with patient and answered her questions. Reassured that she would receive further education and instructions from the diabetes educator. Patient verbalized understanding.

## 2021-01-07 ENCOUNTER — Encounter: Payer: Self-pay | Admitting: *Deleted

## 2021-01-07 ENCOUNTER — Other Ambulatory Visit: Payer: Self-pay

## 2021-01-07 ENCOUNTER — Ambulatory Visit: Payer: Medicaid Other | Admitting: *Deleted

## 2021-01-07 ENCOUNTER — Other Ambulatory Visit: Payer: Self-pay | Admitting: *Deleted

## 2021-01-07 ENCOUNTER — Ambulatory Visit: Payer: Medicaid Other | Attending: Maternal & Fetal Medicine

## 2021-01-07 VITALS — BP 126/57 | HR 105

## 2021-01-07 DIAGNOSIS — O43199 Other malformation of placenta, unspecified trimester: Secondary | ICD-10-CM

## 2021-01-07 DIAGNOSIS — O43193 Other malformation of placenta, third trimester: Secondary | ICD-10-CM

## 2021-01-07 DIAGNOSIS — O24419 Gestational diabetes mellitus in pregnancy, unspecified control: Secondary | ICD-10-CM | POA: Diagnosis not present

## 2021-01-07 DIAGNOSIS — O24410 Gestational diabetes mellitus in pregnancy, diet controlled: Secondary | ICD-10-CM

## 2021-01-07 DIAGNOSIS — O43192 Other malformation of placenta, second trimester: Secondary | ICD-10-CM

## 2021-01-07 DIAGNOSIS — O99212 Obesity complicating pregnancy, second trimester: Secondary | ICD-10-CM | POA: Diagnosis not present

## 2021-01-07 DIAGNOSIS — Z363 Encounter for antenatal screening for malformations: Secondary | ICD-10-CM | POA: Insufficient documentation

## 2021-01-07 DIAGNOSIS — Z3480 Encounter for supervision of other normal pregnancy, unspecified trimester: Secondary | ICD-10-CM | POA: Diagnosis present

## 2021-01-07 DIAGNOSIS — E669 Obesity, unspecified: Secondary | ICD-10-CM | POA: Diagnosis not present

## 2021-01-07 DIAGNOSIS — Z3A29 29 weeks gestation of pregnancy: Secondary | ICD-10-CM | POA: Diagnosis not present

## 2021-01-07 DIAGNOSIS — O99213 Obesity complicating pregnancy, third trimester: Secondary | ICD-10-CM | POA: Insufficient documentation

## 2021-01-07 DIAGNOSIS — Z6835 Body mass index (BMI) 35.0-35.9, adult: Secondary | ICD-10-CM

## 2021-01-14 ENCOUNTER — Encounter: Payer: Self-pay | Admitting: Obstetrics

## 2021-01-14 ENCOUNTER — Ambulatory Visit (INDEPENDENT_AMBULATORY_CARE_PROVIDER_SITE_OTHER): Payer: Medicaid Other | Admitting: Obstetrics

## 2021-01-14 ENCOUNTER — Other Ambulatory Visit: Payer: Self-pay

## 2021-01-14 VITALS — BP 127/76 | HR 109 | Wt 224.7 lb

## 2021-01-14 DIAGNOSIS — O99210 Obesity complicating pregnancy, unspecified trimester: Secondary | ICD-10-CM

## 2021-01-14 DIAGNOSIS — O0990 Supervision of high risk pregnancy, unspecified, unspecified trimester: Secondary | ICD-10-CM

## 2021-01-14 DIAGNOSIS — O43199 Other malformation of placenta, unspecified trimester: Secondary | ICD-10-CM

## 2021-01-14 DIAGNOSIS — O24410 Gestational diabetes mellitus in pregnancy, diet controlled: Secondary | ICD-10-CM

## 2021-01-14 NOTE — Progress Notes (Signed)
Gest Diabetes

## 2021-01-14 NOTE — Progress Notes (Signed)
Subjective:  Debra Wyatt is a 35 y.o. G3P2002 at [redacted]w[redacted]d being seen today for ongoing prenatal care.  She is currently monitored for the following issues for this high-risk pregnancy and has Supervision of other normal pregnancy, antepartum; Obesity affecting pregnancy, antepartum; Marginal insertion of umbilical cord affecting management of mother; and Gestational diabetes mellitus (GDM) affecting pregnancy, antepartum on their problem list.  Patient reports no complaints.  Contractions: Not present. Vag. Bleeding: None.  Movement: Present. Denies leaking of fluid.   The following portions of the patient's history were reviewed and updated as appropriate: allergies, current medications, past family history, past medical history, past social history, past surgical history and problem list. Problem list updated.  Objective:   Vitals:   01/14/21 0935  BP: 127/76  Pulse: (!) 109  Weight: 224 lb 11.2 oz (101.9 kg)    Fetal Status:     Movement: Present     General:  Alert, oriented and cooperative. Patient is in no acute distress.  Skin: Skin is warm and dry. No rash noted.   Cardiovascular: Normal heart rate noted  Respiratory: Normal respiratory effort, no problems with respiration noted  Abdomen: Soft, gravid, appropriate for gestational age. Pain/Pressure: Absent     Pelvic:  Cervical exam deferred        Extremities: Normal range of motion.  Edema: None  Mental Status: Normal mood and affect. Normal behavior. Normal judgment and thought content.   Urinalysis:      Assessment and Plan:  Pregnancy: G3P2002 at [redacted]w[redacted]d  1. Supervision of high risk pregnancy, antepartum  2. Diet controlled gestational diabetes mellitus (GDM) in third trimester Rx: - [facility] to [facility] - has not started checking blood sugars yet  3. Obesity affecting pregnancy, antepartum  4. Marginal insertion of umbilical cord affecting management of mother - followed by  MFM   Preterm labor symptoms and general obstetric precautions including but not limited to vaginal bleeding, contractions, leaking of fluid and fetal movement were reviewed in detail with the patient. Please refer to After Visit Summary for other counseling recommendations.   Return in about 2 weeks (around 01/28/2021) for [facility].   Brock Bad, MD 01/14/2021 10:02 AM

## 2021-01-28 ENCOUNTER — Other Ambulatory Visit: Payer: Self-pay

## 2021-01-28 ENCOUNTER — Ambulatory Visit (INDEPENDENT_AMBULATORY_CARE_PROVIDER_SITE_OTHER): Payer: Medicaid Other | Admitting: Obstetrics

## 2021-01-28 ENCOUNTER — Encounter: Payer: Self-pay | Admitting: Obstetrics

## 2021-01-28 VITALS — Wt 223.8 lb

## 2021-01-28 DIAGNOSIS — O43199 Other malformation of placenta, unspecified trimester: Secondary | ICD-10-CM

## 2021-01-28 DIAGNOSIS — O99210 Obesity complicating pregnancy, unspecified trimester: Secondary | ICD-10-CM

## 2021-01-28 DIAGNOSIS — O26893 Other specified pregnancy related conditions, third trimester: Secondary | ICD-10-CM

## 2021-01-28 DIAGNOSIS — O26843 Uterine size-date discrepancy, third trimester: Secondary | ICD-10-CM

## 2021-01-28 DIAGNOSIS — O0990 Supervision of high risk pregnancy, unspecified, unspecified trimester: Secondary | ICD-10-CM

## 2021-01-28 DIAGNOSIS — O24410 Gestational diabetes mellitus in pregnancy, diet controlled: Secondary | ICD-10-CM

## 2021-01-28 DIAGNOSIS — R12 Heartburn: Secondary | ICD-10-CM

## 2021-01-28 MED ORDER — PANTOPRAZOLE SODIUM 40 MG PO TBEC: 40.0000 mg | DELAYED_RELEASE_TABLET | Freq: Every day | ORAL | 5 refills | Status: DC

## 2021-01-28 NOTE — Progress Notes (Signed)
Subjective:  Debra Wyatt is a 35 y.o. G3P2002 at [redacted]w[redacted]d being seen today for ongoing prenatal care.  She is currently monitored for the following issues for this high-risk pregnancy and has Supervision of other normal pregnancy, antepartum; Obesity affecting pregnancy, antepartum; Marginal insertion of umbilical cord affecting management of mother; and Gestational diabetes mellitus (GDM) affecting pregnancy, antepartum on their problem list.  Patient reports heartburn.  Contractions: Not present. Vag. Bleeding: None.  Movement: Present. Denies leaking of fluid.   The following portions of the patient's history were reviewed and updated as appropriate: allergies, current medications, past family history, past medical history, past social history, past surgical history and problem list. Problem list updated.  Objective:   Vitals:   01/28/21 1047  Weight: 223 lb 12.8 oz (101.5 kg)    Fetal Status:     Movement: Present     General:  Alert, oriented and cooperative. Patient is in no acute distress.  Skin: Skin is warm and dry. No rash noted.   Cardiovascular: Normal heart rate noted  Respiratory: Normal respiratory effort, no problems with respiration noted  Abdomen: Soft, gravid, appropriate for gestational age. Pain/Pressure: Absent     Pelvic:  Cervical exam deferred        Extremities: Normal range of motion.  Edema: None  Mental Status: Normal mood and affect. Normal behavior. Normal judgment and thought content.   Urinalysis:      Assessment and Plan:  Pregnancy: G3P2002 at [redacted]w[redacted]d  1. Supervision of high risk pregnancy, antepartum  2. Marginal insertion of umbilical cord affecting management of mother - followed by MFM  3. Diet controlled gestational diabetes mellitus (GDM) in third trimester - good glucose control  4. Obesity affecting pregnancy, antepartum  5. Size of fetus inconsistent with dates in third trimester - ultrasound for interval growth scheduled  6.  Heartburn during pregnancy in third trimester Rx: - pantoprazole (PROTONIX) 40 MG tablet; Take 1 tablet (40 mg total) by mouth daily.  Dispense: 30 tablet; Refill: 5    Preterm labor symptoms and general obstetric precautions including but not limited to vaginal bleeding, contractions, leaking of fluid and fetal movement were reviewed in detail with the patient. Please refer to After Visit Summary for other counseling recommendations.   Return in about 2 weeks (around 02/11/2021) for ROB.   Brock Bad, MD  01/28/21

## 2021-01-28 NOTE — Progress Notes (Signed)
ROB 32.4 wks No unusual complaints.

## 2021-02-04 ENCOUNTER — Ambulatory Visit: Payer: Medicaid Other | Admitting: *Deleted

## 2021-02-04 ENCOUNTER — Encounter: Payer: Medicaid Other | Attending: Obstetrics | Admitting: Registered"

## 2021-02-04 ENCOUNTER — Other Ambulatory Visit: Payer: Self-pay | Admitting: *Deleted

## 2021-02-04 ENCOUNTER — Encounter: Payer: Self-pay | Admitting: *Deleted

## 2021-02-04 ENCOUNTER — Encounter: Payer: Self-pay | Admitting: Registered"

## 2021-02-04 ENCOUNTER — Other Ambulatory Visit: Payer: Self-pay | Admitting: Obstetrics and Gynecology

## 2021-02-04 ENCOUNTER — Other Ambulatory Visit: Payer: Self-pay

## 2021-02-04 ENCOUNTER — Ambulatory Visit: Payer: Medicaid Other | Attending: Obstetrics and Gynecology

## 2021-02-04 VITALS — BP 126/67 | HR 101

## 2021-02-04 DIAGNOSIS — O43199 Other malformation of placenta, unspecified trimester: Secondary | ICD-10-CM

## 2021-02-04 DIAGNOSIS — O99213 Obesity complicating pregnancy, third trimester: Secondary | ICD-10-CM

## 2021-02-04 DIAGNOSIS — O24419 Gestational diabetes mellitus in pregnancy, unspecified control: Secondary | ICD-10-CM | POA: Diagnosis present

## 2021-02-04 DIAGNOSIS — Z6835 Body mass index (BMI) 35.0-35.9, adult: Secondary | ICD-10-CM

## 2021-02-04 DIAGNOSIS — O43193 Other malformation of placenta, third trimester: Secondary | ICD-10-CM | POA: Diagnosis present

## 2021-02-04 DIAGNOSIS — E669 Obesity, unspecified: Secondary | ICD-10-CM | POA: Diagnosis not present

## 2021-02-04 DIAGNOSIS — O24410 Gestational diabetes mellitus in pregnancy, diet controlled: Secondary | ICD-10-CM

## 2021-02-04 DIAGNOSIS — Z3480 Encounter for supervision of other normal pregnancy, unspecified trimester: Secondary | ICD-10-CM

## 2021-02-04 DIAGNOSIS — Z3483 Encounter for supervision of other normal pregnancy, third trimester: Secondary | ICD-10-CM | POA: Diagnosis not present

## 2021-02-04 DIAGNOSIS — O43192 Other malformation of placenta, second trimester: Secondary | ICD-10-CM | POA: Insufficient documentation

## 2021-02-04 DIAGNOSIS — Z3A33 33 weeks gestation of pregnancy: Secondary | ICD-10-CM

## 2021-02-04 DIAGNOSIS — O99212 Obesity complicating pregnancy, second trimester: Secondary | ICD-10-CM | POA: Diagnosis not present

## 2021-02-04 NOTE — Progress Notes (Signed)
Patient was seen on 02/04/21 for Gestational Diabetes self-management class at [facility]. The following learning objectives were met by the patient during this course:  States the definition of Gestational Diabetes States why dietary management is important in controlling blood glucose Describes the effects each nutrient has on blood glucose levels Demonstrates ability to create a balanced meal plan Demonstrates carbohydrate counting  States when to check blood glucose levels Demonstrates proper blood glucose monitoring techniques States the effect of stress and exercise on blood glucose levels States the importance of limiting caffeine and abstaining from alcohol and smoking  Blood glucose monitor given: Patient has meter but is not checking blood sugar prior to class.  Blood glucose reading: 95 mg/dL  Patient instructed to monitor glucose levels: FBS: 60 - <95; 1 hour: <140; 2 hour: <120  Patient received handouts: Nutrition Diabetes and Pregnancy, including carb counting list  Patient will be seen for follow-up as needed.

## 2021-02-11 ENCOUNTER — Ambulatory Visit (INDEPENDENT_AMBULATORY_CARE_PROVIDER_SITE_OTHER): Payer: Medicaid Other

## 2021-02-11 ENCOUNTER — Ambulatory Visit (INDEPENDENT_AMBULATORY_CARE_PROVIDER_SITE_OTHER): Payer: Medicaid Other | Admitting: Women's Health

## 2021-02-11 ENCOUNTER — Other Ambulatory Visit: Payer: Self-pay

## 2021-02-11 ENCOUNTER — Ambulatory Visit: Payer: Medicaid Other | Admitting: *Deleted

## 2021-02-11 VITALS — BP 117/73 | HR 112 | Wt 223.2 lb

## 2021-02-11 VITALS — BP 120/76 | HR 101 | Wt 224.0 lb

## 2021-02-11 DIAGNOSIS — O43199 Other malformation of placenta, unspecified trimester: Secondary | ICD-10-CM

## 2021-02-11 DIAGNOSIS — O99210 Obesity complicating pregnancy, unspecified trimester: Secondary | ICD-10-CM

## 2021-02-11 DIAGNOSIS — O24410 Gestational diabetes mellitus in pregnancy, diet controlled: Secondary | ICD-10-CM

## 2021-02-11 DIAGNOSIS — Z3A34 34 weeks gestation of pregnancy: Secondary | ICD-10-CM | POA: Diagnosis not present

## 2021-02-11 DIAGNOSIS — Z3480 Encounter for supervision of other normal pregnancy, unspecified trimester: Secondary | ICD-10-CM

## 2021-02-11 DIAGNOSIS — O24419 Gestational diabetes mellitus in pregnancy, unspecified control: Secondary | ICD-10-CM

## 2021-02-11 MED ORDER — METFORMIN HCL 500 MG PO TABS: 500.0000 mg | ORAL_TABLET | Freq: Two times a day (BID) | ORAL | 1 refills | Status: DC

## 2021-02-11 NOTE — Progress Notes (Signed)
Subjective:  Debra Wyatt is a 35 y.o. G3P2002 at [redacted]w[redacted]d being seen today for ongoing prenatal care.  She is currently monitored for the following issues for this high-risk pregnancy and has Supervision of other normal pregnancy, antepartum; Obesity affecting pregnancy, antepartum; Marginal insertion of umbilical cord affecting management of mother; and Gestational diabetes mellitus (GDM) affecting pregnancy, antepartum on their problem list.  Patient reports no complaints.  Contractions: Irregular. Vag. Bleeding: None.  Movement: Present. Denies leaking of fluid.   The following portions of the patient's history were reviewed and updated as appropriate: allergies, current medications, past family history, past medical history, past social history, past surgical history and problem list. Problem list updated.  Objective:   Vitals:   02/11/21 1346  BP: 120/76  Pulse: (!) 101  Weight: 224 lb (101.6 kg)    Fetal Status: Fetal Heart Rate (bpm): 140   Movement: Present     General:  Alert, oriented and cooperative. Patient is in no acute distress.  Skin: Skin is warm and dry. No rash noted.   Cardiovascular: Normal heart rate noted  Respiratory: Normal respiratory effort, no problems with respiration noted  Abdomen: Soft, gravid, appropriate for gestational age. Pain/Pressure: Present     Pelvic: Vag. Bleeding: None     Cervical exam deferred        Extremities: Normal range of motion.     Mental Status: Normal mood and affect. Normal behavior. Normal judgment and thought content.   Urinalysis:      Assessment and Plan:  Pregnancy: G3P2002 at [redacted]w[redacted]d  1. Supervision of other normal pregnancy, antepartum -CBE info given -GBS next regular visit, will be seen in one week for blood sugar log check  PHQ9 SCORE ONLY 02/11/2021 02/04/2021 12/30/2020  PHQ-9 Total Score 2 0 5   GAD 7 : Generalized Anxiety Score 02/11/2021 10/08/2020  Nervous, Anxious, on Edge 0 0  Control/stop worrying 0 0   Worry too much - different things 0 0  Trouble relaxing 0 0  Restless 0 0  Easily annoyed or irritable 0 1  Afraid - awful might happen 0 0  Total GAD 7 Score 0 1  Anxiety Difficulty - Not difficult at all   2. Marginal insertion of umbilical cord affecting management of mother -next growth scheduled 03/04/2021, growth [place] today, no results available yet  3. Obesity affecting pregnancy, antepartum  4. Gestational diabetes mellitus (GDM) affecting pregnancy, antepartum -BPP scheduled 03/04/2021 -pt with approxiamtely 1 week of blood sugar numbers - all fasting elevated, 15/19 postprandial elevated, consulted with Dr. Debroah Loop and will start on Metformin 500mg  BID -will schedule f/u visit in one week for blood sugar log check  5. [redacted] weeks gestation of pregnancy  Preterm labor symptoms and general obstetric precautions including but not limited to vaginal bleeding, contractions, leaking of fluid and fetal movement were reviewed in detail with the patient. I discussed the assessment and treatment plan with the patient. The patient was provided an opportunity to ask questions and all were answered. The patient agreed with the plan and demonstrated an understanding of the instructions. The patient was advised to call back or seek an in-person office evaluation/go to [facility] at [facility] for any urgent or concerning symptoms. Please refer to After Visit Summary for other counseling recommendations.  Return in about 1 week (around 02/18/2021) for in-person HOB/APP OK/check glucose numbers.   Danity Schmelzer, 04/20/2021, NP

## 2021-02-11 NOTE — Progress Notes (Signed)
Pt states glucose has been elevated at times. Pt states she is learning to adjust and make changes to her diet.

## 2021-02-11 NOTE — Patient Instructions (Addendum)
Maternity Assessment Unit (MAU)  The Maternity Assessment Unit (MAU) is located at the [facility] at [facility]. The address is: [address], [place], [place], [place] [address]. Please see map below for additional directions.    The Maternity Assessment Unit is designed to help you during your pregnancy, and for up to 6 weeks after delivery, with any pregnancy- or postpartum-related emergencies, if you think you are in labor, or if your water has broken. For example, if you experience nausea and vomiting, vaginal bleeding, severe abdominal or pelvic pain, elevated blood pressure or other problems related to your pregnancy or postpartum time, please come to the Maternity Assessment Unit for assistance.       Childbirth Education Options: [facility] Department Classes:  Childbirth education classes can help you get ready for a positive parenting experience. You can also meet other expectant parents and get free stuff for your baby. Each class runs for five weeks on the same night and costs $45 for the mother-to-be and her support person. Medicaid covers the cost if you are eligible. Call [phone] to register. [facility] Childbirth Education: Classes can vary in availability and schedule is subject to change. For most up-to-date information please visit [email or web address] to review and register.            Group B Streptococcus Test During Pregnancy Why am I having this test? Routine testing, also called screening, for group B streptococcus (GBS) is recommended for all pregnant women between the 36th and 37th week of pregnancy. GBS is a type of bacteria that can be passed from mother to baby during childbirth. Screening will help guide whether or not you will need treatment during labor and delivery to prevent complications such as: An infection in your uterus during labor. An infection in your uterus after  delivery. A serious infection in your baby after delivery, such as pneumonia, meningitis, or sepsis. GBS screening is not often done before 36 weeks of pregnancy unless you go into labor prematurely. What happens if I have group B streptococcus? If testing shows that you have GBS, your health care provider will recommend treatment with IV antibiotics during labor and delivery. This treatment significantly decreases the risk of complications for you and your baby. If you have a planned C-section and you have GBS, you may not need to be treated with antibiotics because GBS is usually passed to babies after labor starts and your water breaks. If you are in labor or your water breaks before your C-section, it is possible for GBS to get into your uterus and be passed to your baby, so you might need treatment. Is there a chance I may not need to be tested? You may not need to be tested for GBS if: You have a urine test that shows GBS before 36 to 37 weeks. You had a baby with GBS infection after a previous delivery. In these cases, you will automatically be treated for GBS during labor and delivery. What is being tested? This test is done to check if you have group B streptococcus in your vagina or rectum. What kind of sample is taken? To collect samples for this test, your health care provider will swab your vagina and rectum with a cotton swab. The sample is then sent to the lab to see if GBS is present. What happens during the test?  You will remove your clothing from the waist down. You will lie down on an  exam table in the same position as you would for a pelvic exam. Your health care provider will swab your vagina and rectum to collect samples for a culture test. You will be able to go home after the test and do all your usual activities. How are the results reported? The test results are reported as positive or negative. What do the results mean? A positive test means you are at risk for  passing GBS to your baby during labor and delivery. Your health care provider will recommend that you are treated with an IV antibiotic during labor and delivery. A negative test means you are at very low risk of passing GBS to your baby. There is still a low risk of passing GBS to your baby because sometimes test results may report that you do not have a condition when you do (false-negative result) or there is a chance that you may become infected with GBS after the test is done. You most likely will not need to be treated with an antibiotic during labor and delivery. Talk with your health care provider about what your results mean. Questions to ask your health care provider Ask your health care provider, or the department that is doing the test: When will my results be ready? How will I get my results? What are my treatment options? Summary Routine testing (screening) for group B streptococcus (GBS) is recommended for all pregnant women between the 36th and 37th week of pregnancy. GBS is a type of bacteria that can be passed from mother to baby during childbirth. If testing shows that you have GBS, your health care provider will recommend that you are treated with IV antibiotics during labor and delivery. This treatment almost always prevents infection in newborns. This information is not intended to replace advice given to you by your health care provider. Make sure you discuss any questions you have with your health care provider. Document Revised: 02/26/2020 Document Reviewed: 05/24/2018 Elsevier Patient Education  2022 Elsevier Inc.       Gestational Diabetes Mellitus, Self-Care Caring for yourself after a diagnosis of gestational diabetes mellitus means keeping your blood sugar under control. This can be done through nutrition, exercise, lifestyle changes, insulin and other medicines, and support from your health care team. Your health care provider will set individualized treatment  goals for you. What are the risks? If left untreated, gestational diabetes can cause problems for mother and baby. For the mother Women who get gestational diabetes are more likely to: Have labor induced and deliver early. Have problems during labor and delivery, if the baby is larger than normal. This includes difficult labor and damage to the birth canal. Have a cesarean delivery. Have problems with blood pressure, including high blood pressure and preeclampsia. Get it again if they become pregnant. Develop type 2 diabetes in the future. For the baby Gestational diabetes that is not treated can cause the baby to have: Low blood glucose (hypoglycemia). Larger-than-normal body size (macrosomia). Breathing problems. How to monitor blood glucose Check your blood glucose every day and as often as told by your health care provider. To do this: Wash your hands with soap and water for at least 20 seconds. Prick the side of your finger (not the tip) with the lancet. Use a different finger each time. Gently rub the finger until a small drop of blood appears. Follow instructions that come with your meter for inserting the test strip, applying blood to the strip, and getting the result. Write down  your result and any notes. Blood glucose goals are: 95 mg/dL (5.3 mmol/L) when fasting. 140 mg/dL (7.8 mmol/L) 1 hour after a meal. 120 mg/dL (6.7 mmol/L) 2 hours after a meal. Follow these instructions at home: Medicines Take over-the-counter and prescription medicines only as told by your health care provider. If your health care provider prescribed insulin or other diabetes medicines: Take them every day. Do not run out of insulin or other medicines. Plan ahead so you always have them available. Eating and drinking  Follow instructions from your health care provider about eating or drinking restrictions. See a diet and nutrition expert (registered dietician) to help you create an eating plan  that helps control your diabetes. The foods in this plan will include: Lean proteins. Complex carbohydrates. These are carbohydrates that contain fiber, have a lot of nutrients, and are digested slowly. They include dried beans, nuts, and whole grain breads, cereals, or pasta. Fresh fruits and vegetables. Low-fat dairy products. Healthy fats. Eat healthy snacks between nutritious meals. Drink enough fluid to keep your urine pale yellow. Keep a record of the carbohydrates that you eat. To do this: Read food labels. Learn the standard serving sizes of foods. Make a sick day plan with your health care provider before you get sick. Follow this plan whenever you cannot eat or drink as usual. Activity Do exercises as told by your health care provider. Do 30 or more minutes of physical activity a day, or as much physical activity as your health care provider recommends. It may help to control blood glucose levels after a meal if you: Do 10 minutes of exercise after each meal. Start this exercise 30 minutes after the meal. If you start a new exercise or activity, work with your health care provider to adjust your insulin, other medicines, or food as needed. Lifestyle Do not drink alcohol. Do not use any products that contain nicotine or tobacco, such as cigarettes, e-cigarettes, and chewing tobacco. If you need help quitting, ask your health care provider. Learn to manage stress. If you need help with this, ask your health care provider. Body care Keep your vaccines up to date. Practice good oral hygiene. To do this: Clean your teeth and gums two times a day. Floss one or more times a day. Visit your dentist one or more times every 6 months. Stay at a healthy weight while you are pregnant. Your expected weight gain depends on your BMI (body mass index) before pregnancy. General instructions Talk with your health care provider about the risk for high blood pressure during pregnancy (preeclampsia  and eclampsia). Share your diabetes management plan with people in your workplace, school, and household. Check your urine for ketones when sick and as told by your health care provider. Ketones are made by the liver when a lack of glucose forces the body to use fat for energy. Carry a medical alert card or wear medical alert jewelry that says you have gestational diabetes. Keep all follow-up visits. This is important. Get care after delivery Have your blood glucose level checked with an oral glucose tolerance test (OGTT) 4-12 weeks after delivery. Get screened for diabetes at least every 3 years, or as often as told by your health care provider. Where to find more information American Diabetes Association (ADA): diabetes.org Association of Diabetes Care & Education Specialists (ADCES): diabeteseducator.org Centers for Disease Control and Prevention (CDC): TonerPromos.no American Pregnancy Association: americanpregnancy.org U.S. Department of Agriculture MyPlate: WrestlingReporter.dk Contact a health care provider if: Your blood  glucose is above your target for two tests in a row. You have a fever. You have been sick for 2 days or more and are not getting better. You have either of these problems for more than 6 hours: Vomiting every time you eat or drink. Diarrhea. Get help right away if you: Become confused or cannot think clearly. Have trouble breathing. Have moderate or high ketones in your urine. Feel your baby is not moving as usual. Develop unusual discharge or bleeding from your vagina. Start having early (premature) contractions. Contractions may feel like a tightening in your lower abdomen Have a severe headache. These symptoms may represent a serious problem that is an emergency. Do not wait to see if the symptoms will go away. Get medical help right away. Call your local emergency services (911 in the [place] Do not drive yourself to the hospital. Summary Check your blood glucose every day  during your pregnancy. Do this as often as told by your health care provider. Take insulin or other diabetes medicines every day, if your health care provider prescribed them. Have your blood glucose level checked 4-12 weeks after delivery. Keep all follow-up visits. This is important. This information is not intended to replace advice given to you by your health care provider. Make sure you discuss any questions you have with your health care provider. Document Revised: 10/01/2019 Document Reviewed: 10/01/2019 Elsevier Patient Education  2022 Elsevier Inc.       Preterm Labor The normal length of a pregnancy is 39-41 weeks. Preterm labor is when labor starts before 37 completed weeks of pregnancy. Babies who are born prematurely and survive may not be fully developed and may be at an increased risk for long-term problems such as cerebral palsy, developmental delays, and vision and hearing problems. Babies who are born too early may have problems soon after birth. Premature babies may have problems regulating blood sugar, body temperature, heart rate, and breathing rate. These babies often have trouble with feeding. The risk of having problems is highest for babies who are born before 34 weeks of pregnancy. What are the causes? The exact cause of this condition is not known. What increases the risk? You are more likely to have preterm labor if you have certain risk factors that relate to your medical history, problems with present and past pregnancies, and lifestyle factors. Medical history You have abnormalities of the uterus, including a short cervix. You have STIs (sexually transmitted infections) or other infections of the urinary tract and the vagina. You have chronic illnesses, such as blood clotting problems, diabetes, or high blood pressure. You are overweight or underweight. Present and past pregnancies You have had preterm labor before. You are pregnant with twins or other  multiples. You have been diagnosed with a condition in which the placenta covers your cervix (placenta previa). You waited less than 18 months between giving birth and becoming pregnant again. Your unborn baby has some abnormalities. You have vaginal bleeding during pregnancy. You became pregnant through in vitro fertilization (IVF). Lifestyle and environmental factors You use tobacco products or drink alcohol. You use drugs. You have stress and no social support. You experience domestic violence. You are exposed to certain chemicals or environmental pollutants. Other factors You are younger than age 48 or older than age 62. What are the signs or symptoms? Symptoms of this condition include: Cramps similar to those that can happen during a menstrual period. The cramps may happen with diarrhea. Pain in the abdomen or lower  back. Regular contractions that may feel like tightening of the abdomen. A feeling of increased pressure in the pelvis. Increased watery or bloody mucus discharge from the vagina. Water breaking (ruptured amniotic sac). How is this diagnosed? This condition is diagnosed based on: Your medical history and a physical exam. A pelvic exam. An ultrasound. Monitoring your uterus for contractions. Other tests, including: A swab of the cervix to check for a chemical called fetal fibronectin. Urine tests. How is this treated? Treatment for this condition depends on the length of your pregnancy, your condition, and the health of your baby. Treatment may include: Taking medicines, such as: Hormone medicines. These may be given early in pregnancy to help support the pregnancy. Medicines to stop contractions. Medicines to help mature the baby's lungs. These may be prescribed if the risk of delivery is high. Medicines to help protect your baby from brain and nerve complications such as cerebral palsy. Bed rest. If the labor happens before 34 weeks of pregnancy, you may need  to stay in the hospital. Delivery of the baby. Follow these instructions at home:  Do not use any products that contain nicotine or tobacco. These products include cigarettes, chewing tobacco, and vaping devices, such as e-cigarettes. If you need help quitting, ask your health care provider. Do not drink alcohol. Take over-the-counter and prescription medicines only as told by your health care provider. Rest as told by your health care provider. Return to your normal activities as told by your health care provider. Ask your health care provider what activities are safe for you. Keep all follow-up visits. This is important. How is this prevented? To increase your chance of having a full-term pregnancy: Do not use drugs or take medicines that have not been prescribed to you during your pregnancy. Talk with your health care provider before taking any herbal supplements, even if you have been taking them regularly. Make sure you gain a healthy amount of weight during your pregnancy. Watch for infection. If you think that you might have an infection, get it checked right away. Symptoms of infection may include: Fever. Abnormal vaginal discharge or discharge that smells bad. Pain or burning with urination. Needing to urinate urgently. Frequently urinating or passing small amounts of urine frequently. Blood in your urine or urine that smells bad or unusual. Where to find more information U.S. Department of Health and Cytogeneticist on Women's Health: http://hoffman.com/ [employer] of Obstetricians and Gynecologists: www.acog.org Centers for Disease Control and Prevention, Preterm Birth: FootballExhibition.com.br Contact a health care provider if: You think you are going into preterm labor. You have signs or symptoms of preterm labor. You have symptoms of infection. Get help right away if: You are having regular, painful contractions every 5 minutes or less. Your water  breaks. Summary Preterm labor is labor that starts before you reach 37 weeks of pregnancy. Delivering your baby early increases your baby's risk of developing long-term problems. You are more likely to have preterm labor if you have certain risk factors that relate to your medical history, problems with present and past pregnancies, and lifestyle factors. Keep all follow-up visits. This is important. Contact a health care provider if you have signs or symptoms of preterm labor. This information is not intended to replace advice given to you by your health care provider. Make sure you discuss any questions you have with your health care provider. Document Revised: 04/29/2020 Document Reviewed: 04/29/2020 Elsevier Patient Education  2022 [employer].  Contraception Choices - www.bedsider.org Contraception, also called birth control, refers to methods or devices that prevent pregnancy. Hormonal methods Contraceptive implant A contraceptive implant is a thin, plastic tube that contains a hormone that prevents pregnancy. It is different from an intrauterine device (IUD). It is inserted into the upper part of the arm by a health care provider. Implants can be effective for up to 3 years. Progestin-only injections Progestin-only injections are injections of progestin, a synthetic form of the hormone progesterone. They are given every 3 months by a health care provider. Birth control pills Birth control pills are pills that contain hormones that prevent pregnancy. They must be taken once a day, preferably at the same time each day. A prescription is needed to use this method of contraception. Birth control patch The birth control patch contains hormones that prevent pregnancy. It is placed on the skin and must be changed once a week for three weeks and removed on the fourth week. A prescription is needed to use this method of contraception. Vaginal ring A vaginal ring contains hormones  that prevent pregnancy. It is placed in the vagina for three weeks and removed on the fourth week. After that, the process is repeated with a new ring. A prescription is needed to use this method of contraception. Emergency contraceptive Emergency contraceptives prevent pregnancy after unprotected sex. They come in pill form and can be taken up to 5 days after sex. They work best the sooner they are taken after having sex. Most emergency contraceptives are available without a prescription. This method should not be used as your only form of birth control. Barrier methods Female condom A female condom is a thin sheath that is worn over the penis during sex. Condoms keep sperm from going inside a woman's body. They can be used with a sperm-killing substance (spermicide) to increase their effectiveness. They should be thrown away after one use. Female condom A female condom is a soft, loose-fitting sheath that is put into the vagina before sex. The condom keeps sperm from going inside a woman's body. They should be thrown away after one use. Diaphragm A diaphragm is a soft, dome-shaped barrier. It is inserted into the vagina before sex, along with a spermicide. The diaphragm blocks sperm from entering the uterus, and the spermicide kills sperm. A diaphragm should be left in the vagina for 6-8 hours after sex and removed within 24 hours. A diaphragm is prescribed and fitted by a health care provider. A diaphragm should be replaced every 1-2 years, after giving birth, after gaining more than 15 lb (6.8 kg), and after pelvic surgery. Cervical cap A cervical cap is a round, soft latex or plastic cup that fits over the cervix. It is inserted into the vagina before sex, along with spermicide. It blocks sperm from entering the uterus. The cap should be left in place for 6-8 hours after sex and removed within 48 hours. A cervical cap must be prescribed and fitted by a health care provider. It should be replaced every 2  years. Sponge A sponge is a soft, circular piece of polyurethane foam with spermicide in it. The sponge helps block sperm from entering the uterus, and the spermicide kills sperm. To use it, you make it wet and then insert it into the vagina. It should be inserted before sex, left in for at least 6 hours after sex, and removed and thrown away within 30 hours. Spermicides Spermicides are chemicals that kill or block sperm from entering the  cervix and uterus. They can come as a cream, jelly, suppository, foam, or tablet. A spermicide should be inserted into the vagina with an applicator at least 10-15 minutes before sex to allow time for it to work. The process must be repeated every time you have sex. Spermicides do not require a prescription. Intrauterine contraception Intrauterine device (IUD) An IUD is a T-shaped device that is put in a woman's uterus. There are two types: Hormone IUD.This type contains progestin, a synthetic form of the hormone progesterone. This type can stay in place for 3-5 years. Copper IUD.This type is wrapped in copper wire. It can stay in place for 10 years. Permanent methods of contraception Female tubal ligation In this method, a woman's fallopian tubes are sealed, tied, or blocked during surgery to prevent eggs from traveling to the uterus. Hysteroscopic sterilization In this method, a small, flexible insert is placed into each fallopian tube. The inserts cause scar tissue to form in the fallopian tubes and block them, so sperm cannot reach an egg. The procedure takes about 3 months to be effective. Another form of birth control must be used during those 3 months. Female sterilization This is a procedure to tie off the tubes that carry sperm (vasectomy). After the procedure, the man can still ejaculate fluid (semen). Another form of birth control must be used for 3 months after the procedure. Natural planning methods Natural family planning In this method, a couple does  not have sex on days when the woman could become pregnant. Calendar method In this method, the woman keeps track of the length of each menstrual cycle, identifies the days when pregnancy can happen, and does not have sex on those days. Ovulation method In this method, a couple avoids sex during ovulation. Symptothermal method This method involves not having sex during ovulation. The woman typically checks for ovulation by watching changes in her temperature and in the consistency of cervical mucus. Post-ovulation method In this method, a couple waits to have sex until after ovulation. Where to find more information Centers for Disease Control and Prevention: FootballExhibition.com.br Summary Contraception, also called birth control, refers to methods or devices that prevent pregnancy. Hormonal methods of contraception include implants, injections, pills, patches, vaginal rings, and emergency contraceptives. Barrier methods of contraception can include female condoms, female condoms, diaphragms, cervical caps, sponges, and spermicides. There are two types of IUDs (intrauterine devices). An IUD can be put in a woman's uterus to prevent pregnancy for 3-5 years. Permanent sterilization can be done through a procedure for males and females. Natural family planning methods involve nothaving sex on days when the woman could become pregnant. This information is not intended to replace advice given to you by your health care provider. Make sure you discuss any questions you have with your health care provider. Document Revised: 10/01/2019 Document Reviewed: 10/01/2019 Elsevier Patient Education  2022 [employer].

## 2021-02-11 NOTE — Progress Notes (Signed)
Patient was assessed and managed by nursing staff during this encounter. I have reviewed the chart and agree with the documentation and plan. I have also made any necessary editorial changes.  Warden Fillers, MD 02/11/2021 5:20 PM

## 2021-02-11 NOTE — Progress Notes (Signed)
Pt informed that the ultrasound is considered a limited OB ultrasound and is not intended to be a complete ultrasound exam.  Patient also informed that the ultrasound is not being completed with the intent of assessing for fetal or placental anomalies or any pelvic abnormalities.  Explained that the purpose of today's ultrasound is to assess for presentation, BPP and amniotic fluid volume.  Patient acknowledges the purpose of the exam and the limitations of the study.  Pt is checking CBG 4x daily as instructed. She will bring log to Ob f/u appt w/provider later today

## 2021-02-18 ENCOUNTER — Other Ambulatory Visit (HOSPITAL_COMMUNITY)
Admission: RE | Admit: 2021-02-18 | Discharge: 2021-02-18 | Disposition: A | Discharge status: Home / Self Care | Payer: Medicaid Other | Source: Ambulatory Visit | Attending: Obstetrics and Gynecology | Admitting: Obstetrics and Gynecology

## 2021-02-18 ENCOUNTER — Ambulatory Visit (INDEPENDENT_AMBULATORY_CARE_PROVIDER_SITE_OTHER): Payer: Medicaid Other | Admitting: Obstetrics and Gynecology

## 2021-02-18 ENCOUNTER — Ambulatory Visit (INDEPENDENT_AMBULATORY_CARE_PROVIDER_SITE_OTHER): Payer: Medicaid Other

## 2021-02-18 ENCOUNTER — Ambulatory Visit: Payer: Medicaid Other | Admitting: *Deleted

## 2021-02-18 ENCOUNTER — Other Ambulatory Visit: Payer: Self-pay

## 2021-02-18 VITALS — BP 121/67 | HR 110

## 2021-02-18 VITALS — BP 121/78 | HR 102 | Wt 229.0 lb

## 2021-02-18 DIAGNOSIS — O24419 Gestational diabetes mellitus in pregnancy, unspecified control: Secondary | ICD-10-CM

## 2021-02-18 DIAGNOSIS — O99210 Obesity complicating pregnancy, unspecified trimester: Secondary | ICD-10-CM

## 2021-02-18 DIAGNOSIS — Z3480 Encounter for supervision of other normal pregnancy, unspecified trimester: Secondary | ICD-10-CM | POA: Diagnosis not present

## 2021-02-18 DIAGNOSIS — O43199 Other malformation of placenta, unspecified trimester: Secondary | ICD-10-CM

## 2021-02-18 DIAGNOSIS — O24415 Gestational diabetes mellitus in pregnancy, controlled by oral hypoglycemic drugs: Secondary | ICD-10-CM | POA: Diagnosis not present

## 2021-02-18 DIAGNOSIS — Z3A35 35 weeks gestation of pregnancy: Secondary | ICD-10-CM

## 2021-02-18 NOTE — Progress Notes (Signed)
Pt informed that the ultrasound is considered a limited OB ultrasound and is not intended to be a complete ultrasound exam.  Patient also informed that the ultrasound is not being completed with the intent of assessing for fetal or placental anomalies or any pelvic abnormalities.  Explained that the purpose of today's ultrasound is to assess for presentation, BPP and amniotic fluid volume.  Patient acknowledges the purpose of the exam and the limitations of the study.   Pt reports decreased FM since yesterday.  She was aware of frequent FM during BPP and NST today.

## 2021-02-18 NOTE — Progress Notes (Signed)
+   Fetal movement. NST/BPP today. Glucose logs to review.

## 2021-02-18 NOTE — Progress Notes (Signed)
   PRENATAL VISIT NOTE  Subjective:  Debra Wyatt is a 35 y.o. G3P2002 at [redacted]w[redacted]d being seen today for ongoing prenatal care.  She is currently monitored for the following issues for this high-risk pregnancy and has Supervision of other normal pregnancy, antepartum; Obesity affecting pregnancy, antepartum; Marginal insertion of umbilical cord affecting management of mother; Gestational diabetes mellitus (GDM) affecting pregnancy, antepartum; and [redacted] weeks gestation of pregnancy on their problem list.  Patient doing well with no acute concerns today. She reports no complaints.  Contractions: Irregular. Vag. Bleeding: None.  Movement: Present. Denies leaking of fluid.   The following portions of the patient's history were reviewed and updated as appropriate: allergies, current medications, past family history, past medical history, past social history, past surgical history and problem list. Problem list updated.  Objective:   Vitals:   02/18/21 1538  BP: 121/78  Pulse: (!) 102  Weight: 229 lb (103.9 kg)    Fetal Status:   Fundal Height: 39 cm Movement: Present     General:  Alert, oriented and cooperative. Patient is in no acute distress.  Skin: Skin is warm and dry. No rash noted.   Cardiovascular: Normal heart rate noted  Respiratory: Normal respiratory effort, no problems with respiration noted  Abdomen: Soft, gravid, appropriate for gestational age.  Pain/Pressure: Present     Pelvic: Cervical exam performed Dilation: Fingertip Effacement (%): 70 Station: -3  Extremities: Normal range of motion.  Edema: None  Mental Status:  Normal mood and affect. Normal behavior. Normal judgment and thought content.   Assessment and Plan:  Pregnancy: G3P2002 at [redacted]w[redacted]d  1. Gestational diabetes mellitus (GDM) affecting pregnancy, antepartum [place] still elevated FBS: 96-110 PPBS: 103-142, most in range  2. Supervision of other normal pregnancy, antepartum Continue routine care, IOL  scheduled for 39 weeks  - Culture, beta strep (group b only) - Cervicovaginal ancillary only( [place])  3. Marginal insertion of umbilical cord affecting management of mother 10/10 BPP today  4. [redacted] weeks gestation of pregnancy   Preterm labor symptoms and general obstetric precautions including but not limited to vaginal bleeding, contractions, leaking of fluid and fetal movement were reviewed in detail with the patient.  Please refer to After Visit Summary for other counseling recommendations.   Return in about 1 week (around 02/25/2021) for [facility], in person.   Mariel Aloe, MD Faculty Attending Center for [facility]

## 2021-02-19 LAB — CERVICOVAGINAL ANCILLARY ONLY
Chlamydia: NEGATIVE
Comment: NEGATIVE
Comment: NEGATIVE
Comment: NORMAL
Neisseria Gonorrhea: NEGATIVE
Trichomonas: NEGATIVE

## 2021-02-22 LAB — CULTURE, BETA STREP (GROUP B ONLY): Strep Gp B Culture: NEGATIVE

## 2021-02-25 ENCOUNTER — Ambulatory Visit (INDEPENDENT_AMBULATORY_CARE_PROVIDER_SITE_OTHER): Payer: Medicaid Other | Admitting: Obstetrics and Gynecology

## 2021-02-25 ENCOUNTER — Other Ambulatory Visit: Payer: Self-pay

## 2021-02-25 ENCOUNTER — Ambulatory Visit: Payer: Medicaid Other | Admitting: *Deleted

## 2021-02-25 ENCOUNTER — Ambulatory Visit (INDEPENDENT_AMBULATORY_CARE_PROVIDER_SITE_OTHER): Payer: Medicaid Other

## 2021-02-25 VITALS — BP 119/73 | HR 107

## 2021-02-25 VITALS — BP 113/71 | HR 105 | Wt 227.0 lb

## 2021-02-25 DIAGNOSIS — O24415 Gestational diabetes mellitus in pregnancy, controlled by oral hypoglycemic drugs: Secondary | ICD-10-CM

## 2021-02-25 DIAGNOSIS — Z3480 Encounter for supervision of other normal pregnancy, unspecified trimester: Secondary | ICD-10-CM

## 2021-02-25 DIAGNOSIS — O24419 Gestational diabetes mellitus in pregnancy, unspecified control: Secondary | ICD-10-CM

## 2021-02-25 DIAGNOSIS — Z6841 Body Mass Index (BMI) 40.0 and over, adult: Secondary | ICD-10-CM

## 2021-02-25 DIAGNOSIS — O99210 Obesity complicating pregnancy, unspecified trimester: Secondary | ICD-10-CM | POA: Diagnosis not present

## 2021-02-25 DIAGNOSIS — Z3A36 36 weeks gestation of pregnancy: Secondary | ICD-10-CM | POA: Insufficient documentation

## 2021-02-25 DIAGNOSIS — O43199 Other malformation of placenta, unspecified trimester: Secondary | ICD-10-CM

## 2021-02-25 NOTE — Progress Notes (Signed)
Pt informed that the ultrasound is considered a limited OB ultrasound and is not intended to be a complete ultrasound exam.  Patient also informed that the ultrasound is not being completed with the intent of assessing for fetal or placental anomalies or any pelvic abnormalities.  Explained that the purpose of today's ultrasound is to assess for presentation, BPP and amniotic fluid volume.  Patient acknowledges the purpose of the exam and the limitations of the study.    [place] for growth is scheduled on 10/26

## 2021-02-25 NOTE — Progress Notes (Signed)
Pt would like to discuss concerns about IOL. Pt had u/s today- results not back.

## 2021-02-25 NOTE — Progress Notes (Signed)
   PRENATAL VISIT NOTE  Subjective:  Debra Wyatt is a 35 y.o. G3P2002 at [redacted]w[redacted]d being seen today for ongoing prenatal care.  She is currently monitored for the following issues for this high-risk pregnancy and has Supervision of other normal pregnancy, antepartum; Obesity affecting pregnancy, antepartum; Marginal insertion of umbilical cord affecting management of mother; Gestational diabetes mellitus (GDM) affecting pregnancy, antepartum; [redacted] weeks gestation of pregnancy; [redacted] weeks gestation of pregnancy; and BMI 40.0-44.9, adult (HCC) on their problem list.  Patient doing well with no acute concerns today. She reports no complaints.  Contractions: Irregular. Vag. Bleeding: None.  Movement: Present. Denies leaking of fluid.   The following portions of the patient's history were reviewed and updated as appropriate: allergies, current medications, past family history, past medical history, past social history, past surgical history and problem list. Problem list updated.  Objective:   Vitals:   02/25/21 1446 02/25/21 1451  BP: (!) 143/80 113/71  Pulse: (!) 112 (!) 105  Weight: 227 lb (103 kg)     Fetal Status: Fetal Heart Rate (bpm): 150 Fundal Height: 37 cm Movement: Present     General:  Alert, oriented and cooperative. Patient is in no acute distress.  Skin: Skin is warm and dry. No rash noted.   Cardiovascular: Normal heart rate noted  Respiratory: Normal respiratory effort, no problems with respiration noted  Abdomen: Soft, gravid, appropriate for gestational age.  Pain/Pressure: Present     Pelvic: Cervical exam deferred        Extremities: Normal range of motion.     Mental Status:  Normal mood and affect. Normal behavior. Normal judgment and thought content.   Assessment and Plan:  Pregnancy: G3P2002 at [redacted]w[redacted]d  1. [redacted] weeks gestation of pregnancy   2. Marginal insertion of umbilical cord affecting management of mother Continue weekly BPP  3. Supervision of other normal  pregnancy, antepartum Continue routine care  4. Gestational diabetes mellitus (GDM) affecting pregnancy, antepartum Controlled with metformin, pt did not bring in blood sugars Best recollection  FBS: low 90s PPBS 115-20s  10/10 BPP today  5. BMI 40.0-44.9, adult (HCC)   Preterm labor symptoms and general obstetric precautions including but not limited to vaginal bleeding, contractions, leaking of fluid and fetal movement were reviewed in detail with the patient.  Please refer to After Visit Summary for other counseling recommendations.   Return in about 1 week (around 03/04/2021) for [facility], in person.   Mariel Aloe, MD Faculty Attending Center for [facility]

## 2021-03-04 ENCOUNTER — Other Ambulatory Visit: Payer: Self-pay

## 2021-03-04 ENCOUNTER — Ambulatory Visit (INDEPENDENT_AMBULATORY_CARE_PROVIDER_SITE_OTHER): Payer: Medicaid Other | Admitting: Obstetrics and Gynecology

## 2021-03-04 ENCOUNTER — Other Ambulatory Visit: Payer: Self-pay | Admitting: Advanced Practice Midwife

## 2021-03-04 ENCOUNTER — Encounter: Payer: Self-pay | Admitting: *Deleted

## 2021-03-04 ENCOUNTER — Ambulatory Visit: Payer: Medicaid Other | Attending: Obstetrics and Gynecology

## 2021-03-04 ENCOUNTER — Ambulatory Visit: Payer: Medicaid Other | Admitting: *Deleted

## 2021-03-04 VITALS — BP 124/68 | HR 106

## 2021-03-04 VITALS — BP 122/79 | HR 102 | Wt 226.0 lb

## 2021-03-04 DIAGNOSIS — O3663X1 Maternal care for excessive fetal growth, third trimester, fetus 1: Secondary | ICD-10-CM

## 2021-03-04 DIAGNOSIS — O99213 Obesity complicating pregnancy, third trimester: Secondary | ICD-10-CM

## 2021-03-04 DIAGNOSIS — O43193 Other malformation of placenta, third trimester: Secondary | ICD-10-CM

## 2021-03-04 DIAGNOSIS — Z6841 Body Mass Index (BMI) 40.0 and over, adult: Secondary | ICD-10-CM

## 2021-03-04 DIAGNOSIS — Z3A37 37 weeks gestation of pregnancy: Secondary | ICD-10-CM

## 2021-03-04 DIAGNOSIS — O24410 Gestational diabetes mellitus in pregnancy, diet controlled: Secondary | ICD-10-CM | POA: Insufficient documentation

## 2021-03-04 DIAGNOSIS — O24419 Gestational diabetes mellitus in pregnancy, unspecified control: Secondary | ICD-10-CM

## 2021-03-04 DIAGNOSIS — O43199 Other malformation of placenta, unspecified trimester: Secondary | ICD-10-CM | POA: Diagnosis present

## 2021-03-04 DIAGNOSIS — O24415 Gestational diabetes mellitus in pregnancy, controlled by oral hypoglycemic drugs: Secondary | ICD-10-CM | POA: Diagnosis not present

## 2021-03-04 DIAGNOSIS — Z3480 Encounter for supervision of other normal pregnancy, unspecified trimester: Secondary | ICD-10-CM

## 2021-03-04 DIAGNOSIS — E669 Obesity, unspecified: Secondary | ICD-10-CM | POA: Diagnosis not present

## 2021-03-04 NOTE — Progress Notes (Signed)
   PRENATAL VISIT NOTE  Subjective:  Debra Wyatt is a 35 y.o. G3P2002 at 108w4d being seen today for ongoing prenatal care.  She is currently monitored for the following issues for this high-risk pregnancy and has Supervision of other normal pregnancy, antepartum; Obesity affecting pregnancy, antepartum; Marginal insertion of umbilical cord affecting management of mother; Gestational diabetes mellitus (GDM) affecting pregnancy, antepartum; [redacted] weeks gestation of pregnancy; [redacted] weeks gestation of pregnancy; BMI 40.0-44.9, adult (HCC); [redacted] weeks gestation of pregnancy; and Large for gestational age fetus affecting management of mother, antepartum, third trimester, fetus 1 on their problem list.  Patient doing well with no acute concerns today. She reports no complaints.  Contractions: Irregular. Vag. Bleeding: None.  Movement: Present. Denies leaking of fluid.   The following portions of the patient's history were reviewed and updated as appropriate: allergies, current medications, past family history, past medical history, past social history, past surgical history and problem list. Problem list updated.  Objective:   Vitals:   03/04/21 1452 03/04/21 1457  BP: (!) 149/75 122/79  Pulse: (!) 109 (!) 102  Weight: 226 lb (102.5 kg)     Fetal Status: Fetal Heart Rate (bpm): 150   Movement: Present  Presentation: Vertex  General:  Alert, oriented and cooperative. Patient is in no acute distress.  Skin: Skin is warm and dry. No rash noted.   Cardiovascular: Normal heart rate noted  Respiratory: Normal respiratory effort, no problems with respiration noted  Abdomen: Soft, gravid, appropriate for gestational age.  Pain/Pressure: Present     Pelvic: Cervical exam performed Dilation: Fingertip Effacement (%): 60 Station: -3  Extremities: Normal range of motion.     Mental Status:  Normal mood and affect. Normal behavior. Normal judgment and thought content.   Assessment and Plan:  Pregnancy:  G3P2002 at [redacted]w[redacted]d  1. [redacted] weeks gestation of pregnancy   2. Gestational diabetes mellitus (GDM) affecting pregnancy, antepartum FBS: 80-101 PPBS: 101-125  3. Supervision of other normal pregnancy, antepartum Continue routine care, NST / BPP in 1 week IOL scheduled  4. BMI 40.0-44.9, adult (HCC)   5. Marginal insertion of umbilical cord affecting management of mother   6. Large for gestational age fetus affecting management of mother, antepartum, third trimester, fetus 1 EFW>99%, 9 pounds 1 ounce  Term labor symptoms and general obstetric precautions including but not limited to vaginal bleeding, contractions, leaking of fluid and fetal movement were reviewed in detail with the patient.  Please refer to After Visit Summary for other counseling recommendations.   Return in about 1 week (around 03/11/2021) for [facility], in person.   Mariel Aloe, MD Faculty Attending Center for [facility]

## 2021-03-04 NOTE — Progress Notes (Signed)
Pt would like cervix check today.  Pt is scheduled for IOL next week.

## 2021-03-11 ENCOUNTER — Inpatient Hospital Stay (HOSPITAL_COMMUNITY)
Admission: AD | Admit: 2021-03-11 | Discharge: 2021-03-13 | DRG: 805 | Disposition: A | Discharge status: Home / Self Care | Payer: Medicaid Other | Attending: Obstetrics and Gynecology | Admitting: Obstetrics and Gynecology

## 2021-03-11 ENCOUNTER — Other Ambulatory Visit: Payer: Self-pay

## 2021-03-11 ENCOUNTER — Inpatient Hospital Stay (HOSPITAL_COMMUNITY): Payer: Medicaid Other | Admitting: Anesthesiology

## 2021-03-11 ENCOUNTER — Encounter (HOSPITAL_COMMUNITY): Payer: Self-pay | Admitting: Obstetrics & Gynecology

## 2021-03-11 ENCOUNTER — Other Ambulatory Visit: Payer: Medicaid Other

## 2021-03-11 ENCOUNTER — Encounter: Payer: Medicaid Other | Admitting: Obstetrics & Gynecology

## 2021-03-11 DIAGNOSIS — Z3A38 38 weeks gestation of pregnancy: Secondary | ICD-10-CM

## 2021-03-11 DIAGNOSIS — O4202 Full-term premature rupture of membranes, onset of labor within 24 hours of rupture: Secondary | ICD-10-CM | POA: Diagnosis not present

## 2021-03-11 DIAGNOSIS — Z20822 Contact with and (suspected) exposure to covid-19: Secondary | ICD-10-CM | POA: Diagnosis present

## 2021-03-11 DIAGNOSIS — O24415 Gestational diabetes mellitus in pregnancy, controlled by oral hypoglycemic drugs: Secondary | ICD-10-CM | POA: Diagnosis present

## 2021-03-11 DIAGNOSIS — O99214 Obesity complicating childbirth: Secondary | ICD-10-CM | POA: Diagnosis present

## 2021-03-11 DIAGNOSIS — O3663X0 Maternal care for excessive fetal growth, third trimester, not applicable or unspecified: Secondary | ICD-10-CM | POA: Diagnosis present

## 2021-03-11 DIAGNOSIS — O24425 Gestational diabetes mellitus in childbirth, controlled by oral hypoglycemic drugs: Secondary | ICD-10-CM | POA: Diagnosis present

## 2021-03-11 DIAGNOSIS — O43123 Velamentous insertion of umbilical cord, third trimester: Secondary | ICD-10-CM | POA: Diagnosis present

## 2021-03-11 DIAGNOSIS — O411230 Chorioamnionitis, third trimester, not applicable or unspecified: Secondary | ICD-10-CM | POA: Diagnosis present

## 2021-03-11 DIAGNOSIS — O26893 Other specified pregnancy related conditions, third trimester: Secondary | ICD-10-CM | POA: Diagnosis present

## 2021-03-11 HISTORY — DX: Unspecified ovarian cyst, unspecified side: N83.209

## 2021-03-11 HISTORY — DX: Urinary tract infection, site not specified: N39.0

## 2021-03-11 HISTORY — DX: Gestational diabetes mellitus in pregnancy, unspecified control: O24.419

## 2021-03-11 HISTORY — DX: Dermatitis, unspecified: L30.9

## 2021-03-11 LAB — GLUCOSE, CAPILLARY
Glucose-Capillary: 96 mg/dL (ref 70–99)
Glucose-Capillary: 82 mg/dL (ref 70–99)
Glucose-Capillary: 71 mg/dL (ref 70–99)
Glucose-Capillary: 90 mg/dL (ref 70–99)

## 2021-03-11 LAB — CBC
HCT: 35.1 % — ABNORMAL LOW (ref 36.0–46.0)
Hemoglobin: 11.4 g/dL — ABNORMAL LOW (ref 12.0–15.0)
MCH: 26.5 pg (ref 26.0–34.0)
MCHC: 32.5 g/dL (ref 30.0–36.0)
MCV: 81.6 fL (ref 80.0–100.0)
Platelets: 213 10*3/uL (ref 150–400)
RBC: 4.3 MIL/uL (ref 3.87–5.11)
RDW: 13.7 % (ref 11.5–15.5)
WBC: 9.2 10*3/uL (ref 4.0–10.5)
nRBC: 0 % (ref 0.0–0.2)

## 2021-03-11 LAB — RESP PANEL BY RT-PCR (FLU A&B, COVID) ARPGX2
Influenza A by PCR: NEGATIVE
Influenza B by PCR: NEGATIVE
SARS Coronavirus 2 by RT PCR: NEGATIVE

## 2021-03-11 LAB — TYPE AND SCREEN
ABO/RH(D): O POS
Antibody Screen: NEGATIVE

## 2021-03-11 LAB — POCT FERN TEST: POCT Fern Test: POSITIVE

## 2021-03-11 MED ORDER — LIDOCAINE HCL (PF) 1 % IJ SOLN
INTRAMUSCULAR | Status: DC | PRN
  Administered 2021-03-11: 11 mL via EPIDURAL

## 2021-03-11 MED ORDER — LIDOCAINE HCL (PF) 1 % IJ SOLN: 30.0000 mL | INTRAMUSCULAR | Status: DC | PRN

## 2021-03-11 MED ORDER — PHENYLEPHRINE 40 MCG/ML (10ML) SYRINGE FOR IV PUSH (FOR BLOOD PRESSURE SUPPORT): 80.0000 ug | PREFILLED_SYRINGE | INTRAVENOUS | Status: DC | PRN

## 2021-03-11 MED ORDER — OXYTOCIN-SODIUM CHLORIDE 30-0.9 UT/500ML-% IV SOLN
1.0000 m[IU]/min | INTRAVENOUS | Status: DC
  Administered 2021-03-11: 2 m[IU]/min via INTRAVENOUS
  Filled 2021-03-11: qty 500

## 2021-03-11 MED ORDER — LACTATED RINGERS IV SOLN: 500.0000 mL | INTRAVENOUS | Status: DC | PRN

## 2021-03-11 MED ORDER — FENTANYL CITRATE (PF) 100 MCG/2ML IJ SOLN: 50.0000 ug | INTRAMUSCULAR | Status: DC | PRN

## 2021-03-11 MED ORDER — EPHEDRINE 5 MG/ML INJ: 10.0000 mg | INTRAVENOUS | Status: DC | PRN

## 2021-03-11 MED ORDER — OXYTOCIN BOLUS FROM INFUSION
333.0000 mL | Freq: Once | INTRAVENOUS | Status: AC
  Administered 2021-03-12: 333 mL via INTRAVENOUS

## 2021-03-11 MED ORDER — LACTATED RINGERS IV SOLN: 500.0000 mL | Freq: Once | INTRAVENOUS | Status: DC

## 2021-03-11 MED ORDER — FENTANYL-BUPIVACAINE-NACL 0.5-0.125-0.9 MG/250ML-% EP SOLN
12.0000 mL/h | EPIDURAL | Status: DC | PRN
  Administered 2021-03-11: 12 mL/h via EPIDURAL
  Filled 2021-03-11: qty 250

## 2021-03-11 MED ORDER — ONDANSETRON HCL 4 MG/2ML IJ SOLN
4.0000 mg | Freq: Four times a day (QID) | INTRAMUSCULAR | Status: DC | PRN
  Administered 2021-03-11: 4 mg via INTRAVENOUS
  Filled 2021-03-11: qty 2

## 2021-03-11 MED ORDER — DIPHENHYDRAMINE HCL 50 MG/ML IJ SOLN: 12.5000 mg | INTRAMUSCULAR | Status: DC | PRN

## 2021-03-11 MED ORDER — OXYTOCIN-SODIUM CHLORIDE 30-0.9 UT/500ML-% IV SOLN
2.5000 [IU]/h | INTRAVENOUS | Status: DC
  Administered 2021-03-12: 2.5 [IU]/h via INTRAVENOUS

## 2021-03-11 MED ORDER — ACETAMINOPHEN 325 MG PO TABS
650.0000 mg | ORAL_TABLET | ORAL | Status: DC | PRN
  Administered 2021-03-12: 650 mg via ORAL
  Filled 2021-03-11: qty 2

## 2021-03-11 MED ORDER — SOD CITRATE-CITRIC ACID 500-334 MG/5ML PO SOLN
30.0000 mL | ORAL | Status: DC | PRN
  Administered 2021-03-11: 30 mL via ORAL
  Filled 2021-03-11: qty 30

## 2021-03-11 MED ORDER — LACTATED RINGERS IV SOLN
INTRAVENOUS | Status: DC
  Administered 2021-03-11: 950 mL via INTRAVENOUS

## 2021-03-11 MED ORDER — MISOPROSTOL 25 MCG QUARTER TABLET: 25.0000 ug | ORAL_TABLET | ORAL | Status: DC | PRN

## 2021-03-11 MED ORDER — TERBUTALINE SULFATE 1 MG/ML IJ SOLN: 0.2500 mg | Freq: Once | INTRAMUSCULAR | Status: DC | PRN

## 2021-03-11 MED ORDER — OXYCODONE-ACETAMINOPHEN 5-325 MG PO TABS: 1.0000 | ORAL_TABLET | ORAL | Status: DC | PRN

## 2021-03-11 NOTE — MAU Note (Signed)
Started leaking around 0815, clear fluid- just keeps coming, has a towel wrapped around her. No bleeding.  Mild cramping started last night. Has GDM.

## 2021-03-11 NOTE — Progress Notes (Signed)
Patient ID: Debra Wyatt, female   DOB: [birth year], 35 y.o.   MRN: [id number] Feeling some pressure  Vitals:   03/11/21 2030 03/11/21 2031 03/11/21 2101 03/11/21 2131  BP: 138/88 138/88 124/73 (!) 108/53  Pulse: (!) 106 (!) 106 (!) 113 98  Resp:      Temp:      TempSrc:      SpO2:      Weight:      Height:       FHR stable  Dilation: 10 Dilation Complete Date: 03/11/21 Dilation Complete Time: 2156 Effacement (%): 70 Cervical Position: Posterior Station: -2 Presentation: Vertex Exam by:: Diannia Ruder, RN  WIll labor down a while then start pushing

## 2021-03-11 NOTE — Progress Notes (Signed)
Labor Progress Note Debra Wyatt is a 35 y.o. G3P2002 at [redacted]w[redacted]d presented for SROM, early labor  S:  Comfortable, feeling rare ctx  O:  BP 128/72   Pulse 94   Temp 98.3 F (36.8 C) (Oral)   Resp 18   Ht 5\' 2"  (1.575 m)   Wt 103.7 kg   LMP 05/29/2020   SpO2 98%   BMI 41.83 kg/m  EFM: baseline 140 bpm/ mod variability/ + accels/ no decels  Toco/IUPC: irregular SVE: Dilation: 3.5 Effacement (%): 70 Cervical Position: Posterior Station: -2 Presentation: Vertex Exam by:: Jesiah Yerby, cnm   A/P: 35 y.o. G3P2002 [redacted]w[redacted]d  1. Labor: latent 2. FWB: Cat I 3. Pain: analgesia/anesthesia/NO prn 4. GDM: stable  Minimal cervical change, recommend Pitocin augmentation, pt agrees. Anticipate labor progress and SVD.  [redacted]w[redacted]d, CNM 2:21 PM

## 2021-03-11 NOTE — H&P (Signed)
OBSTETRIC ADMISSION HISTORY AND PHYSICAL  Debra Wyatt is a 35 y.o. female G3P2002 with IUP at 30w4dpresenting for SROM and ealry labor. She reports +FMs. No VB, blurry vision, headaches, peripheral edema, or RUQ pain. She plans on breast and bottlefeeding. She requests IUD or Depo for birth control.  Dating: By 12w [place]--->  Estimated Date of Delivery: 03/21/21  Sono:    @[redacted]w[redacted]d , normal anatomy, ceph presentation, 4098g, 99%ile, EFW 9'1  Prenatal History/Complications: - AO2UMP- LGA fetus - Obesity - marginal cord insertion - hx of SD  Past Medical History: Past Medical History:  Diagnosis Date   Eczema    Gestational diabetes    Headache    Medical history non-contributory    Ovarian cyst    UTI (urinary tract infection)     Past Surgical History: Past Surgical History:  Procedure Laterality Date   TONSILLECTOMY     WISDOM TOOTH EXTRACTION      Obstetrical History: OB History     Gravida  3   Para  2   Term  2   Preterm      AB      Living  2      SAB      IAB      Ectopic      Multiple  0   Live Births  2           Social History: Social History   Socioeconomic History   Marital status: Single    Spouse name: Not on file   Number of children: 2   Years of education: Not on file   Highest education level: Not on file  Occupational History   Not on file  Tobacco Use   Smoking status: Never   Smokeless tobacco: Never  Vaping Use   Vaping Use: Never used  Substance and Sexual Activity   Alcohol use: Not Currently    Comment: occ   Drug use: No   Sexual activity: Yes    Partners: Male    Birth control/protection: None  Other Topics Concern   Not on file  Social History Narrative   Not on file   Social Determinants of Health   Financial Resource Strain: Not on file  Food Insecurity: No Food Insecurity   Worried About Running Out of Food in the Last Year: Never true   Ran Out of Food in the Last Year: Never true   Transportation Needs: No Transportation Needs   Lack of Transportation (Medical): No   Lack of Transportation (Non-Medical): No  Physical Activity: Not on file  Stress: Not on file  Social Connections: Not on file    Family History: Family History  Problem Relation Age of Onset   Healthy Mother    Hypertension Father    Asthma Brother    Cancer Maternal Grandfather    Diabetes Paternal Grandmother    Diabetes Paternal Grandfather     Allergies: No Known Allergies  Medications Prior to Admission  Medication Sig Dispense Refill Last Dose   metFORMIN (GLUCOPHAGE) 500 MG tablet Take 1 tablet (500 mg total) by mouth 2 (two) times daily with a meal. 60 tablet 1 03/10/2021   pantoprazole (PROTONIX) 40 MG tablet Take 1 tablet (40 mg total) by mouth daily. 30 tablet 5 03/10/2021   Prenat-Fe Poly-Methfol-FA-DHA (VITAFOL ULTRA) 29-0.6-0.4-200 MG CAPS Take 1 capsule by mouth daily before breakfast. 90 capsule 3 03/10/2021   Accu-Chek Softclix Lancets lancets 1 each by Other route 4 (  four) times daily. 100 each 12    Blood Glucose Monitoring Suppl (ACCU-CHEK GUIDE) w/Device KIT 1 Device by Does not apply route 4 (four) times daily. 1 kit 0    Blood Pressure Monitoring (BLOOD PRESSURE KIT) DEVI 1 kit by Does not apply route once a week. Check Blood Pressure regularly and record readings into the [employer] App.  Large Cuff.  DX O90.0 (Patient not taking: Reported on 03/04/2021) 1 each 0    Elastic Bandages & Supports (COMFORT FIT MATERNITY SUPP LG) MISC Wear daily when ambulating (Patient not taking: Reported on 03/04/2021) 1 each 0    glucose blood (ACCU-CHEK GUIDE) test strip Use to check blood sugars four times a day was instructed 100 each 12    Misc. Devices (GOJJI WEIGHT SCALE) MISC 1 kit by Does not apply route once a week. 1 each 0      Review of Systems:  All systems reviewed and negative except as stated in HPI  PE: Blood pressure 134/78, pulse 98, temperature 98.4 F (36.9 C),  temperature source Oral, resp. rate 18, height 5' 2"  (1.575 m), weight 103.7 kg, last menstrual period 05/29/2020, SpO2 98 %. General appearance: alert, cooperative, and no distress Lungs: regular rate and effort Heart: regular rate  Abdomen: soft, non-tender Extremities: Homans sign is negative, no sign of DVT Presentation: cephalic by BSUS EFM: 431 bpm, mod variability, + accels, no decels Toco: 3-4 Dilation: 3.5 Effacement (%): 90 Station: -2, -3 Exam by:: jolynn  Prenatal labs: ABO, Rh: O/Positive/-- (06/06 1333) Antibody: Negative (06/06 1333) Rubella: 1.34 (06/06 1333) RPR: Non Reactive (08/23 1132)  HBsAg: Negative (06/06 1333)  HIV: Non Reactive (08/23 1132)  GBS: Negative/-- (10/12 1619)  2 hr GTT abnormal  Prenatal Transfer Tool  Maternal Diabetes: Yes:  Diabetes Type:  Insulin/Medication controlled Genetic Screening: Normal Maternal Ultrasounds/Referrals: Normal and Other:LGA Fetal Ultrasounds or other Referrals:  Referred to Materal Fetal Medicine  Maternal Substance Abuse:  No Significant Maternal Medications:  Meds include: Other: Metformin Significant Maternal Lab Results: Group B Strep negative  Results for orders placed or performed during the hospital encounter of 03/11/21 (from the past 24 hour(s))  Fern Test   Collection Time: 03/11/21  9:26 AM  Result Value Ref Range   POCT Fern Test Positive = ruptured amniotic membanes   Glucose, capillary   Collection Time: 03/11/21  9:38 AM  Result Value Ref Range   Glucose-Capillary 96 70 - 99 mg/dL    Patient Active Problem List   Diagnosis Date Noted   Gestational diabetes mellitus (GDM) in third trimester controlled on oral hypoglycemic drug 03/11/2021   [redacted] weeks gestation of pregnancy 03/04/2021   Large for gestational age fetus affecting management of mother, antepartum, third trimester, fetus 1 03/04/2021   [redacted] weeks gestation of pregnancy 02/25/2021   BMI 40.0-44.9, adult ([place]) 02/25/2021   [redacted] weeks  gestation of pregnancy 02/18/2021   Gestational diabetes mellitus (GDM) affecting pregnancy, antepartum 12/31/2020   Obesity affecting pregnancy, antepartum 12/09/2020   Marginal insertion of umbilical cord affecting management of mother 12/09/2020   Supervision of other normal pregnancy, antepartum 10/07/2020    Assessment: Seryna Marek is a 35 y.o. G3P2002 at 46w4dhere for SROM/early labor  1. Labor: latent 2. FWB: Cat I 3. Pain: analgesia/anesthesia 4. GBS: neg 5. A2GDM  Plan: Admit to LD CBGs Expectant mngt, Pitocin as indicated Counseled extensively on risks of SD since she had previous SD with a smaller baby. She was informed a SD is  an emergency that may require special maneuvers and ultimately may require CS to delivery the baby, and fetal injury may occur which may have long lasting effects. Pt verbalizes understanding of risks.   Julianne Handler, CNM  03/11/2021, 9:57 AM

## 2021-03-11 NOTE — Progress Notes (Signed)
Labor Progress Note Debra Wyatt is a 35 y.o. G3P2002 at [redacted]w[redacted]d presented for SROM/early labor  S:  Comfortable with epidural.  O:  BP (!) 113/56   Pulse 93   Temp 98.2 F (36.8 C) (Oral)   Resp 16   Ht 5\' 2"  (1.575 m)   Wt 103.7 kg   LMP 05/29/2020   SpO2 100%   BMI 41.83 kg/m  EFM: baseline 140 bpm/ mod variability/ + accels/ no decels  Toco/IUPC: 2-4 SVE: Dilation: 5.5 Effacement (%): 70 Cervical Position: Posterior Station: -3 Presentation: Vertex Exam by:: Atisha Hamidi, cnm Pitocin: 8 mu/min  A/P: 35 y.o. G3P2002 [redacted]w[redacted]d  1. Labor: early active 2. FWB: Cat I 3. Pain: epidural 4. GDM: stable  IUPC placed, continue Pitocin titration. Anticipate labor progress and SVD.  [redacted]w[redacted]d, CNM 6:31 PM

## 2021-03-11 NOTE — Anesthesia Preprocedure Evaluation (Signed)
Anesthesia Evaluation  Patient identified by MRN, date of birth, ID band Patient awake    Reviewed: Allergy & Precautions, NPO status , Patient's Chart, lab work & pertinent test results  Airway Mallampati: II  TM Distance: >3 FB Neck ROM: Full    Dental no notable dental hx.    Pulmonary neg pulmonary ROS,    Pulmonary exam normal breath sounds clear to auscultation       Cardiovascular negative cardio ROS Normal cardiovascular exam Rhythm:Regular Rate:Normal     Neuro/Psych negative neurological ROS  negative psych ROS   GI/Hepatic negative GI ROS, Neg liver ROS,   Endo/Other  negative endocrine ROSdiabetes  Renal/GU negative Renal ROS  negative genitourinary   Musculoskeletal negative musculoskeletal ROS (+)   Abdominal (+) + obese,   Peds negative pediatric ROS (+)  Hematology negative hematology ROS (+)   Anesthesia Other Findings   Reproductive/Obstetrics (+) Pregnancy                             Anesthesia Physical Anesthesia Plan  ASA: 3  Anesthesia Plan: Epidural   Post-op Pain Management:    Induction:   PONV Risk Score and Plan:   Airway Management Planned:   Additional Equipment:   Intra-op Plan:   Post-operative Plan:   Informed Consent:   Plan Discussed with:   Anesthesia Plan Comments:         Anesthesia Quick Evaluation  

## 2021-03-11 NOTE — Anesthesia Procedure Notes (Signed)
Epidural Patient location during procedure: OB Start time: 03/11/2021 5:02 PM End time: 03/11/2021 5:17 PM  Staffing Anesthesiologist: Lowella Curb, MD Performed: anesthesiologist   Preanesthetic Checklist Completed: patient identified, IV checked, site marked, risks and benefits discussed, surgical consent, monitors and equipment checked, pre-op evaluation and timeout performed  Epidural Patient position: sitting Prep: ChloraPrep Patient monitoring: heart rate, cardiac monitor, continuous pulse ox and blood pressure Approach: midline Location: L2-L3 Injection technique: LOR saline  Needle:  Needle type: Tuohy  Needle gauge: 17 G Needle length: 9 cm Needle insertion depth: 7 cm Catheter type: closed end flexible Catheter size: 20 Guage Catheter at skin depth: 12 cm Test dose: negative  Assessment Events: blood not aspirated, injection not painful, no injection resistance, no paresthesia and negative IV test  Additional Notes Reason for block:procedure for pain

## 2021-03-12 ENCOUNTER — Encounter (HOSPITAL_COMMUNITY): Payer: Self-pay | Admitting: Obstetrics and Gynecology

## 2021-03-12 DIAGNOSIS — Z3A38 38 weeks gestation of pregnancy: Secondary | ICD-10-CM

## 2021-03-12 DIAGNOSIS — O24425 Gestational diabetes mellitus in childbirth, controlled by oral hypoglycemic drugs: Secondary | ICD-10-CM

## 2021-03-12 DIAGNOSIS — O4202 Full-term premature rupture of membranes, onset of labor within 24 hours of rupture: Secondary | ICD-10-CM

## 2021-03-12 LAB — GLUCOSE, CAPILLARY
Glucose-Capillary: 91 mg/dL (ref 70–99)
Glucose-Capillary: 153 mg/dL — ABNORMAL HIGH (ref 70–99)
Glucose-Capillary: 110 mg/dL — ABNORMAL HIGH (ref 70–99)

## 2021-03-12 LAB — CBC
HCT: 32 % — ABNORMAL LOW (ref 36.0–46.0)
Hemoglobin: 10.7 g/dL — ABNORMAL LOW (ref 12.0–15.0)
MCH: 27 pg (ref 26.0–34.0)
MCHC: 33.4 g/dL (ref 30.0–36.0)
MCV: 80.8 fL (ref 80.0–100.0)
Platelets: 233 10*3/uL (ref 150–400)
RBC: 3.96 MIL/uL (ref 3.87–5.11)
RDW: 13.6 % (ref 11.5–15.5)
WBC: 19.3 10*3/uL — ABNORMAL HIGH (ref 4.0–10.5)
nRBC: 0 % (ref 0.0–0.2)

## 2021-03-12 LAB — RPR: RPR Ser Ql: NONREACTIVE

## 2021-03-12 MED ORDER — SIMETHICONE 80 MG PO CHEW: 80.0000 mg | CHEWABLE_TABLET | ORAL | Status: DC | PRN

## 2021-03-12 MED ORDER — WITCH HAZEL-GLYCERIN EX PADS
1.0000 "application " | MEDICATED_PAD | CUTANEOUS | Status: DC | PRN
  Administered 2021-03-12: 1 via TOPICAL

## 2021-03-12 MED ORDER — DIPHENHYDRAMINE HCL 25 MG PO CAPS: 25.0000 mg | ORAL_CAPSULE | Freq: Four times a day (QID) | ORAL | Status: DC | PRN

## 2021-03-12 MED ORDER — POLYETHYLENE GLYCOL 3350 17 G PO PACK: 17.0000 g | PACK | Freq: Every day | ORAL | Status: DC | PRN

## 2021-03-12 MED ORDER — SODIUM CHLORIDE 0.9 % IV SOLN
2.0000 g | Freq: Four times a day (QID) | INTRAVENOUS | Status: DC
  Administered 2021-03-12: 2 g via INTRAVENOUS
  Filled 2021-03-12: qty 2000

## 2021-03-12 MED ORDER — SENNOSIDES-DOCUSATE SODIUM 8.6-50 MG PO TABS: 2.0000 | ORAL_TABLET | Freq: Every day | ORAL | Status: DC

## 2021-03-12 MED ORDER — DIBUCAINE (PERIANAL) 1 % EX OINT
1.0000 "application " | TOPICAL_OINTMENT | CUTANEOUS | Status: DC | PRN
  Administered 2021-03-12: 1 via RECTAL
  Filled 2021-03-12: qty 28

## 2021-03-12 MED ORDER — TETANUS-DIPHTH-ACELL PERTUSSIS 5-2.5-18.5 LF-MCG/0.5 IM SUSY: 0.5000 mL | PREFILLED_SYRINGE | Freq: Once | INTRAMUSCULAR | Status: DC

## 2021-03-12 MED ORDER — BENZOCAINE-MENTHOL 20-0.5 % EX AERO
1.0000 "application " | INHALATION_SPRAY | CUTANEOUS | Status: DC | PRN
  Administered 2021-03-12: 1 via TOPICAL
  Filled 2021-03-12: qty 56

## 2021-03-12 MED ORDER — COCONUT OIL OIL: 1.0000 "application " | TOPICAL_OIL | Status: DC | PRN

## 2021-03-12 MED ORDER — ACETAMINOPHEN 325 MG PO TABS
650.0000 mg | ORAL_TABLET | ORAL | Status: DC | PRN
  Administered 2021-03-12: 650 mg via ORAL
  Filled 2021-03-12: qty 2

## 2021-03-12 MED ORDER — IBUPROFEN 600 MG PO TABS
600.0000 mg | ORAL_TABLET | Freq: Four times a day (QID) | ORAL | Status: DC
  Administered 2021-03-12 – 2021-03-13 (×5): 600 mg via ORAL
  Filled 2021-03-12 (×5): qty 1

## 2021-03-12 MED ORDER — GENTAMICIN SULFATE 40 MG/ML IJ SOLN
5.0000 mg/kg | INTRAMUSCULAR | Status: DC
  Administered 2021-03-12: 360 mg via INTRAVENOUS
  Filled 2021-03-12: qty 9

## 2021-03-12 MED ORDER — PRENATAL MULTIVITAMIN CH
1.0000 | ORAL_TABLET | Freq: Every day | ORAL | Status: DC
  Administered 2021-03-12: 1 via ORAL
  Filled 2021-03-12: qty 1

## 2021-03-12 MED ORDER — ONDANSETRON HCL 4 MG PO TABS: 4.0000 mg | ORAL_TABLET | ORAL | Status: DC | PRN

## 2021-03-12 MED ORDER — ONDANSETRON HCL 4 MG/2ML IJ SOLN: 4.0000 mg | INTRAMUSCULAR | Status: DC | PRN

## 2021-03-12 NOTE — Discharge Summary (Addendum)
Postpartum Discharge Summary  Patient Name: Debra Wyatt DOB: 07-30-1985 MRN: [id number]  Date of admission: 03/11/2021 Delivery date:03/12/2021  Delivering provider: Seabron Spates  Date of discharge: 03/13/2021  Admitting diagnosis: Gestational diabetes mellitus (GDM) in third trimester controlled on oral hypoglycemic drug [O24.415] Intrauterine pregnancy: [redacted]w[redacted]d    Secondary diagnosis:  Active Problems:   Gestational diabetes mellitus (GDM) in third trimester controlled on oral hypoglycemic drug  Additional problems: 1 minute shoulder dystocia    Discharge diagnosis: Term Pregnancy Delivered and shoulder dystocia, Gestational Diabetes                                               Post partum procedures: None Augmentation: Pitocin Complications: Intrauterine Inflammation or infection (Chorioamniotis) Shoulder Dystocia Hospital course: Onset of Labor With Vaginal Delivery      35y.o. yo G3P2002 at [id number] admitted in Latent Labor on 03/11/2021. Patient had an uncomplicated labor course as follows:  Membrane Rupture Time/Date: 8:15 AM ,03/11/2021   Had some contractions but required Pitocin augmentation  Pushed over 2 hours Delivery Method:Vaginal, Spontaneous  1 minute shoulder dystocia Episiotomy: None none Lacerations:  None None Patient had an uncomplicated postpartum course.  She is ambulating, tolerating a regular diet, passing flatus, and urinating well. Patient is discharged home in stable condition on 03/13/21.  Newborn Data: Birth date:03/12/2021  Birth time:3:46 AM  Gender:Female  Living status:Living  Apgars:3 ,9  Weight:4338 g   Magnesium Sulfate received: No BMZ received: No Rhophylac:N/A MMR:N/A T-DaP:Given prenatally Flu: N/A Transfusion:No  Physical exam  Vitals:   03/12/21 1457 03/12/21 1833 03/12/21 2020 03/13/21 0810  BP: (!) 125/54 124/65 118/65 128/80  Pulse: 81 79 82   Resp: 18 18    Temp: 97.8 F (36.6 C) 98.3 F (36.8 C)     TempSrc: Oral Oral    SpO2: 100% 99%    Weight:      Height:       General: alert, cooperative, and no distress Lochia: appropriate Uterine Fundus: firm Incision: N/A DVT Evaluation: No evidence of DVT seen on physical exam. Labs: Lab Results  Component Value Date   WBC 19.3 (H) 03/12/2021   HGB 10.7 (L) 03/12/2021   HCT 32.0 (L) 03/12/2021   MCV 80.8 03/12/2021   PLT 233 03/12/2021   No flowsheet data found. Edinburgh Score: Edinburgh Postnatal Depression Scale Screening Tool 03/12/2021  I have been able to laugh and see the funny side of things. 0  I have looked forward with enjoyment to things. 0  I have blamed myself unnecessarily when things went wrong. 0  I have been anxious or worried for no good reason. 1  I have felt scared or panicky for no good reason. 0  Things have been getting on top of me. 1  I have been so unhappy that I have had difficulty sleeping. 0  I have felt sad or miserable. 0  I have been so unhappy that I have been crying. 0  The thought of harming myself has occurred to me. 0  Edinburgh Postnatal Depression Scale Total 2    After visit meds:  Allergies as of 03/13/2021   No Known Allergies      Medication List     STOP taking these medications    Accu-Chek Guide test strip Generic drug: glucose blood   Accu-Chek Guide w/Device  Kit   Accu-Chek Softclix Lancets lancets   Blood Pressure Kit Chemical engineer Maternity Supp Lg Misc   Gojji Weight Scale Misc   metFORMIN 500 MG tablet Commonly known as: Glucophage       TAKE these medications    acetaminophen 325 MG tablet Commonly known as: Tylenol Take 2 tablets (650 mg total) by mouth every 4 (four) hours as needed (for pain scale < 4).   ibuprofen 600 MG tablet Commonly known as: ADVIL Take 1 tablet (600 mg total) by mouth every 6 (six) hours.   pantoprazole 40 MG tablet Commonly known as: Protonix Take 1 tablet (40 mg total) by mouth daily.   Vitafol Ultra  29-0.6-0.4-200 MG Caps Take 1 capsule by mouth daily before breakfast.        Discharge home in stable condition Infant Feeding: Bottle and Breast Infant Disposition:rooming in Discharge instruction: per After Visit Summary and Postpartum booklet. Activity: Advance as tolerated. Pelvic rest for 6 weeks.  Diet: routine diet Anticipated Birth Control: IUD outpatient Postpartum Appointment:4 weeks Additional Postpartum F/U: None Future Appointments: Future Appointments  Date Time Provider [place]  04/09/2021 10:55 AM Constant, Vickii Chafe, MD [place] None  04/23/2021  8:30 AM [facility] None   Follow up Visit:  Wells Guiles, DO 03/13/2021, 8:47 AM PGY-1, [place] [facility] Supervision of Resident: Evaluation and management procedures were performed by the [facility] Resident under my supervision. I was immediately available for direct supervision, assistance and direction throughout this encounter.  I also confirm that I have verified the information documented in the resident's note, and that I have also personally reperformed the pertinent components of the physical exam and all of the medical decision making activities.  I have also made any necessary editorial changes.  Renee Harder, CNM 03/13/2021 11:52 AM

## 2021-03-12 NOTE — Progress Notes (Signed)
ANTIBIOTIC CONSULT NOTE - INITIAL  Pharmacy Consult for Gentamicin Indication: Chorioamnionitis , maternal fever  No Known Allergies  Patient Measurements: Height: 5\' 2"  (157.5 cm) Weight: 103.7 kg (228 lb 11.2 oz) IBW/kg (Calculated) : 50.1 Adjusted Body Weight: 71.5 kg  Vital Signs: Temp: 99.8 F (37.7 C) (11/03 0257) Temp Source: Oral (11/03 0257) BP: 137/77 (11/03 0351) Pulse Rate: 131 (11/03 0351)  Labs: Recent Labs    03/11/21 0939  WBC 9.2  HGB 11.4*  PLT 213   No results for input(s): GENTTROUGH, GENTPEAK, GENTRANDOM in the last 72 hours.   Microbiology: Recent Results (from the past 720 hour(s))  Culture, beta strep (group b only)     Status: None   Collection Time: 02/18/21  4:19 PM   Specimen: Vaginal/Rectal; Genital   VR  Result Value Ref Range Status   Strep Gp B Culture Negative Negative Final    Comment: Centers for Disease Control and Prevention (CDC) and American Congress of Obstetricians and Gynecologists (ACOG) guidelines for prevention of perinatal group B streptococcal (GBS) disease specify co-collection of a vaginal and rectal swab specimen to maximize sensitivity of GBS detection. Per the CDC and ACOG, swabbing both the lower vagina and rectum substantially increases the yield of detection compared with sampling the vagina alone. Penicillin G, ampicillin, or cefazolin are indicated for intrapartum prophylaxis of perinatal GBS colonization. Reflex susceptibility testing should be performed prior to use of clindamycin only on GBS isolates from penicillin-allergic women who are considered a high risk for anaphylaxis. Treatment with vancomycin without additional testing is warranted if resistance to clindamycin is noted.   Resp Panel by RT-PCR (Flu A&B, Covid) Nasopharyngeal Swab     Status: None   Collection Time: 03/11/21  9:50 AM   Specimen: Nasopharyngeal Swab; Nasopharyngeal(NP) swabs in vial transport medium  Result Value Ref Range  Status   SARS Coronavirus 2 by RT PCR NEGATIVE NEGATIVE Final    Comment: (NOTE) SARS-CoV-2 target nucleic acids are NOT DETECTED.  The SARS-CoV-2 RNA is generally detectable in upper respiratory specimens during the acute phase of infection. The lowest concentration of SARS-CoV-2 viral copies this assay can detect is 138 copies/mL. A negative result does not preclude SARS-Cov-2 infection and should not be used as the sole basis for treatment or other patient management decisions. A negative result may occur with  improper specimen collection/handling, submission of specimen other than nasopharyngeal swab, presence of viral mutation(s) within the areas targeted by this assay, and inadequate number of viral copies(<138 copies/mL). A negative result must be combined with clinical observations, patient history, and epidemiological information. The expected result is Negative.  Fact Sheet for Patients:  13/02/22  Fact Sheet for Healthcare Providers:  BloggerCourse.com  This test is no t yet approved or cleared by the [place] FDA and  has been authorized for detection and/or diagnosis of SARS-CoV-2 by FDA under an Emergency Use Authorization (EUA). This EUA will remain  in effect (meaning this test can be used) for the duration of the COVID-19 declaration under Section 564(b)(1) of the Act, 21 U.S.C.section 360bbb-3(b)(1), unless the authorization is terminated  or revoked sooner.       Influenza A by PCR NEGATIVE NEGATIVE Final   Influenza B by PCR NEGATIVE NEGATIVE Final    Comment: (NOTE) The Xpert Xpress SARS-CoV-2/FLU/RSV plus assay is intended as an aid in the diagnosis of influenza from Nasopharyngeal swab specimens and should not be used as a sole basis for treatment. Nasal washings and aspirates are  unacceptable for Xpert Xpress SARS-CoV-2/FLU/RSV testing.  Fact Sheet for  Patients: BloggerCourse.com  Fact Sheet for Healthcare Providers: SeriousBroker.it  This test is not yet approved or cleared by the [place] FDA and has been authorized for detection and/or diagnosis of SARS-CoV-2 by FDA under an Emergency Use Authorization (EUA). This EUA will remain in effect (meaning this test can be used) for the duration of the COVID-19 declaration under Section 564(b)(1) of the Act, 21 U.S.C. section 360bbb-3(b)(1), unless the authorization is terminated or revoked.  Performed at [facility], [address], [place], [place] [address]     Medications:  Ampicillin 2g Q6  Assessment: 35 y.o. female G3P2002 at [redacted]w[redacted]d presented for SROM and labor.  Now with maternal fever, and presumed chorioamnionitis.  Requested to start gentamicin per pharmacy Estimated  Vd = 0.38   Plan:  Gentamicin 5 mg/kg mg IV every 24 hrs   Will check gentamicin levels if continued > 72hr or clinically indicated.  Loyola Mast 03/12/2021,4:08 AM

## 2021-03-12 NOTE — Progress Notes (Signed)
Patient ID: Catarina Huntley, female   DOB: 1985-07-20, 35 y.o.   MRN: [id number] Feeling pressure  Vitals:   03/11/21 2240 03/11/21 2301 03/11/21 2331 03/11/21 2356  BP: 125/73 126/69 120/67   Pulse: (!) 109 (!) 102 (!) 110   Resp:      Temp: 98.4 F (36.9 C)   99.5 F (37.5 C)  TempSrc: Oral   Oral  SpO2:      Weight:      Height:       FHR reassuring with early decels  Dilation: 10 Dilation Complete Date: 03/11/21 Dilation Complete Time: 2156 Effacement (%): 70 Cervical Position: Posterior Station: -1 Presentation: Vertex Exam by:: Diannia Ruder, RN  Will start pushing

## 2021-03-12 NOTE — Progress Notes (Signed)
Patient ID: Debra Wyatt, female   DOB: 1985-11-17, 35 y.o.   MRN: [id number] Had pushed for a while then requested to rest  We gave her Ampicillin and Gentamycin for maternal fever  She is now pushing again, total time about 1.5 hours  FHR stable   UCs q23min  Cervix exam deferred  Will continue to observe and prepare for SVD

## 2021-03-12 NOTE — Anesthesia Postprocedure Evaluation (Signed)
Anesthesia Post Note  Patient: Debra Wyatt  Procedure(s) Performed: AN AD HOC LABOR EPIDURAL     Patient location during evaluation: Mother Baby Anesthesia Type: Epidural Level of consciousness: awake and alert Pain management: pain level controlled Vital Signs Assessment: post-procedure vital signs reviewed and stable Respiratory status: spontaneous breathing, nonlabored ventilation and respiratory function stable Cardiovascular status: stable Postop Assessment: no headache, no backache and epidural receding Anesthetic complications: no   No notable events documented.  Last Vitals:  Vitals:   03/12/21 0712 03/12/21 1058  BP: 130/74 121/73  Pulse: 80 87  Resp: 18 18  Temp: (!) 36.3 C 37 C  SpO2: 100% 99%    Last Pain:  Vitals:   03/12/21 1200  TempSrc:   PainSc: Asleep   Pain Goal: Patients Stated Pain Goal: 10 (03/11/21 1009)                 [name]

## 2021-03-13 ENCOUNTER — Other Ambulatory Visit (HOSPITAL_COMMUNITY): Payer: Self-pay

## 2021-03-13 MED ORDER — IBUPROFEN 600 MG PO TABS
600.0000 mg | ORAL_TABLET | Freq: Four times a day (QID) | ORAL | 0 refills | Status: DC
  Filled 2021-03-13: qty 30, 8d supply, fill #0

## 2021-03-13 MED ORDER — ACETAMINOPHEN 325 MG PO TABS
650.0000 mg | ORAL_TABLET | ORAL | 0 refills | Status: DC | PRN
  Filled 2021-03-13: qty 30, 3d supply, fill #0

## 2021-03-14 ENCOUNTER — Inpatient Hospital Stay (HOSPITAL_COMMUNITY): Payer: Medicaid Other

## 2021-03-14 ENCOUNTER — Inpatient Hospital Stay (HOSPITAL_COMMUNITY)
Admission: AD | Admit: 2021-03-14 | Payer: Medicaid Other | Source: Home / Self Care | Admitting: Obstetrics & Gynecology

## 2021-03-26 ENCOUNTER — Telehealth (HOSPITAL_COMMUNITY): Payer: Self-pay | Admitting: *Deleted

## 2021-03-26 NOTE — Telephone Encounter (Signed)
Left message to return nurse call.  Duffy Rhody, RN 03-26-2021 at 3:50pm

## 2021-04-09 ENCOUNTER — Encounter: Payer: Self-pay | Admitting: Obstetrics and Gynecology

## 2021-04-09 ENCOUNTER — Ambulatory Visit (INDEPENDENT_AMBULATORY_CARE_PROVIDER_SITE_OTHER): Payer: Medicaid Other | Admitting: Obstetrics and Gynecology

## 2021-04-09 ENCOUNTER — Other Ambulatory Visit: Payer: Self-pay

## 2021-04-09 DIAGNOSIS — Z30430 Encounter for insertion of intrauterine contraceptive device: Secondary | ICD-10-CM

## 2021-04-09 MED ORDER — LEVONORGESTREL 20 MCG/DAY IU IUD
1.0000 | INTRAUTERINE_SYSTEM | Freq: Once | INTRAUTERINE | Status: AC
Start: 2021-04-09 — End: 2021-04-09
  Administered 2021-04-09: 1 via INTRAUTERINE

## 2021-04-09 NOTE — Progress Notes (Signed)
Post Partum Visit Note  Debra Wyatt is a 35 y.o. G37P3003 female who presents for a postpartum visit. She is 4 weeks postpartum following a normal spontaneous vaginal delivery.  I have fully reviewed the prenatal and intrapartum course. The delivery was at 38.5 gestational weeks.  Anesthesia: epidural. Postpartum course has been uncomplicated. Baby is doing well/ yes. Baby is feeding by breast. Bleeding staining only. Bowel function is normal. Bladder function is abnormal: dysuria . Patient is not sexually active. Contraception method is none. Postpartum depression screening: negative.   The pregnancy intention screening data noted above was reviewed. Potential methods of contraception were discussed. The patient elected to proceed with No data recorded.    Health Maintenance Due  Topic Date Due   HEMOGLOBIN A1C  Never done   COVID-19 Vaccine (1) Never done   Pneumococcal Vaccine 37-72 Years old (1 - PCV) Never done   FOOT EXAM  Never done   OPHTHALMOLOGY EXAM  Never done   URINE MICROALBUMIN  Never done   INFLUENZA VACCINE  12/08/2020       Review of Systems Pertinent items noted in HPI and remainder of comprehensive ROS otherwise negative.  Objective:  BP 119/80   Pulse 69   Ht 5' 2.5" (1.588 m)   Wt 205 lb 1.6 oz (93 kg)   Breastfeeding Yes   BMI 36.92 kg/m    General:  alert, cooperative, and no distress   Breasts:  normal  Lungs: clear to auscultation bilaterally  Heart:  regular rate and rhythm  Abdomen: soft, non-tender; bowel sounds normal; no masses,  no organomegaly   Wound N/a  GU exam:  normal       Assessment:    1. Routine postpartum follow-up  Normal postpartum exam.   Plan:   Essential components of care per ACOG recommendations:  1.  Mood and well being: Patient with negative depression screening today. Reviewed local resources for support.  - Patient tobacco use? No.   - hx of drug use? No.    2. Infant care and feeding:  -Patient  currently breastmilk feeding? Yes. Discussed returning to work and pumping. Reviewed importance of draining breast regularly to support lactation.  -Social determinants of health (SDOH) reviewed in EPIC. No concerns  3. Sexuality, contraception and birth spacing - Patient does not want a pregnancy in the next year.  Desired family size is 3 children.  - Reviewed forms of contraception in tiered fashion. Patient desired IUD today.   - Discussed birth spacing of 18 months  IUD Procedure Note Patient identified, informed consent performed, signed copy in chart, time out was performed.  Urine pregnancy test negative.  Speculum placed in the vagina.  Cervix visualized.  Cleaned with Betadine x 2.  Grasped anteriorly with a single tooth tenaculum.  Uterus sounded to 8 cm.  Mirena IUD placed per manufacturer's recommendations.  Strings trimmed to 3 cm. Tenaculum was removed, good hemostasis noted.  Patient tolerated procedure well.   Patient given post procedure instructions and Mirena care card with expiration date.  Patient is asked to check IUD strings periodically and follow up in 4-6 weeks for IUD check.   4. Sleep and fatigue -Encouraged family/partner/community support of 4 hrs of uninterrupted sleep to help with mood and fatigue  5. Physical Recovery  - Discussed patients delivery and complications. She describes her labor as good. - Patient had a Vaginal, no problems at delivery. Perineal healing reviewed. Patient expressed understanding - Patient has urinary  incontinence? No. - Patient is safe to resume physical and sexual activity  6.  Health Maintenance - HM due items addressed Yes - Last pap smear  Diagnosis  Date Value Ref Range Status  08/11/2020   Final   - Negative for intraepithelial lesion or malignancy (NILM)   Pap smear not done at today's visit.  -Breast Cancer screening indicated? No.   7. Chronic Disease/Pregnancy Condition follow up: Gestational Diabetes Patient  needs to return fasting for pp glucola  - PCP follow up  Catalina Antigua, MD [facility] for [facility], [facility]

## 2021-04-09 NOTE — Progress Notes (Signed)
Would like Mirena.

## 2021-04-23 ENCOUNTER — Other Ambulatory Visit: Payer: Medicaid Other

## 2021-04-23 ENCOUNTER — Other Ambulatory Visit: Payer: Self-pay

## 2021-04-24 LAB — GLUCOSE TOLERANCE, 2 HOURS
Glucose, 2 hour: 81 mg/dL (ref 70–139)
Glucose, GTT - Fasting: 83 mg/dL (ref 70–99)

## 2021-05-07 ENCOUNTER — Ambulatory Visit (INDEPENDENT_AMBULATORY_CARE_PROVIDER_SITE_OTHER): Payer: Medicaid Other | Admitting: Medical

## 2021-05-07 ENCOUNTER — Encounter: Payer: Self-pay | Admitting: Medical

## 2021-05-07 ENCOUNTER — Other Ambulatory Visit: Payer: Self-pay

## 2021-05-07 VITALS — BP 124/77 | HR 72 | Wt 201.0 lb

## 2021-05-07 DIAGNOSIS — Z30431 Encounter for routine checking of intrauterine contraceptive device: Secondary | ICD-10-CM | POA: Diagnosis not present

## 2021-05-07 DIAGNOSIS — Z8632 Personal history of gestational diabetes: Secondary | ICD-10-CM | POA: Diagnosis not present

## 2021-05-07 NOTE — Progress Notes (Signed)
°  History:  Ms. Early Ord is a 35 y.o. [phone] who presents to clinic today for IUD string check. IUD was placed in the office by Dr. Jolayne Panther on 04/09/2021. The patient states intermittent spotting since insertion. Denies heavy bleeding or pain.    The following portions of the patient's history were reviewed and updated as appropriate: allergies, current medications, family history, past medical history, social history, past surgical history and problem list.  Review of Systems:  Review of Systems  Constitutional:  Negative for fever.  Gastrointestinal:  Negative for abdominal pain.  Genitourinary:        + spotting Neg - abnormal discharge     Objective:  Physical Exam BP 124/77    Pulse 72    Wt 201 lb (91.2 kg)    BMI 36.18 kg/m  Physical Exam Vitals and nursing note reviewed. Exam conducted with a chaperone present.  Constitutional:      General: She is not in acute distress.    Appearance: She is well-developed.  Cardiovascular:     Rate and Rhythm: Normal rate.  Pulmonary:     Effort: Pulmonary effort is normal.  Abdominal:     General: Abdomen is flat.     Palpations: Abdomen is soft.  Genitourinary:    General: Normal vulva.     Vagina: Bleeding (scant) present. No vaginal discharge.     Cervix: No cervical motion tenderness, discharge or friability.  Skin:    General: Skin is warm and dry.     Findings: No erythema.  Neurological:     Mental Status: She is alert and oriented to person, place, and time.    Health Maintenance Due  Topic Date Due   HEMOGLOBIN A1C  Never done   COVID-19 Vaccine (1) Never done   Pneumococcal Vaccine 64-27 Years old (1 - PCV) Never done   FOOT EXAM  Never done   OPHTHALMOLOGY EXAM  Never done   URINE MICROALBUMIN  Never done   INFLUENZA VACCINE  12/08/2020     Assessment & Plan:  1. IUD check up - IUD strings are visible and appear appropriate length  Patient had normal pap smear 08/2020, advised that next pap  smear is due 08/2023 and annual exam can be scheduled 08/2021  Approximately 7 minutes of total time was spent with this patient on chart review, history taking, physical exam and documentation.   Marny Lowenstein, PA-C 05/07/2021 10:25 AM

## 2021-05-07 NOTE — Progress Notes (Signed)
Pt states she is doing well with IUD, no concerns today.

## 2021-10-07 ENCOUNTER — Encounter: Payer: Self-pay | Admitting: Obstetrics

## 2021-10-07 ENCOUNTER — Ambulatory Visit (INDEPENDENT_AMBULATORY_CARE_PROVIDER_SITE_OTHER): Payer: Medicaid Other | Admitting: Obstetrics

## 2021-10-07 VITALS — BP 125/82 | HR 76 | Wt 205.2 lb

## 2021-10-07 DIAGNOSIS — Z3009 Encounter for other general counseling and advice on contraception: Secondary | ICD-10-CM

## 2021-10-07 DIAGNOSIS — Z0000 Encounter for general adult medical examination without abnormal findings: Secondary | ICD-10-CM

## 2021-10-07 DIAGNOSIS — N946 Dysmenorrhea, unspecified: Secondary | ICD-10-CM | POA: Diagnosis not present

## 2021-10-07 DIAGNOSIS — Z30432 Encounter for removal of intrauterine contraceptive device: Secondary | ICD-10-CM

## 2021-10-07 DIAGNOSIS — Z30011 Encounter for initial prescription of contraceptive pills: Secondary | ICD-10-CM

## 2021-10-07 DIAGNOSIS — Z3480 Encounter for supervision of other normal pregnancy, unspecified trimester: Secondary | ICD-10-CM

## 2021-10-07 LAB — POCT URINE PREGNANCY: Preg Test, Ur: NEGATIVE

## 2021-10-07 MED ORDER — IBUPROFEN 800 MG PO TABS: 800.0000 mg | ORAL_TABLET | Freq: Three times a day (TID) | ORAL | 5 refills | Status: DC | PRN

## 2021-10-07 MED ORDER — NORGESTIMATE-ETH ESTRADIOL 0.25-35 MG-MCG PO TABS: 1.0000 | ORAL_TABLET | Freq: Every day | ORAL | 11 refills | Status: DC

## 2021-10-07 MED ORDER — VITAFOL ULTRA 29-0.6-0.4-200 MG PO CAPS: 1.0000 | ORAL_CAPSULE | Freq: Every day | ORAL | 3 refills | Status: DC

## 2021-10-07 NOTE — Progress Notes (Signed)
Pt is in the office for IUD removal, inserted on 05/07/21. Pt reports irregular bleeding and reports cramping and discomfort with the device. Pt would like to switch to [facility] pills.

## 2021-10-07 NOTE — Progress Notes (Signed)
    GYNECOLOGY OFFICE PROCEDURE NOTE  Sylwia Odem is a 36 y.o. G3P3003 here for Mirena IUD removal. No GYN concerns.  Last pap smear was on 08-11-21 and was normal.  IUD Removal  Patient identified, informed consent performed, consent signed.  Patient was in the dorsal lithotomy position, normal external genitalia was noted.  A speculum was placed in the patient's vagina, normal discharge was noted, no lesions. The cervix was visualized, no lesions, no abnormal discharge.  The strings of the IUD were grasped and pulled using ring forceps.  Patient tolerated the procedure well.    Patient will use OCP's for contraception.  Routine preventative health maintenance measures emphasized.  Shelly Bombard, MD, [place] for [facility], [place], [place] 10/07/21

## 2021-11-24 IMAGING — US US MFM OB LIMITED
1 series · 15 of 28 positions shown · non-contrast
Comparison: none

[Series 1: us mfm ob limited · 41 acquisitions, 15 frames shown]
[im 1/41]
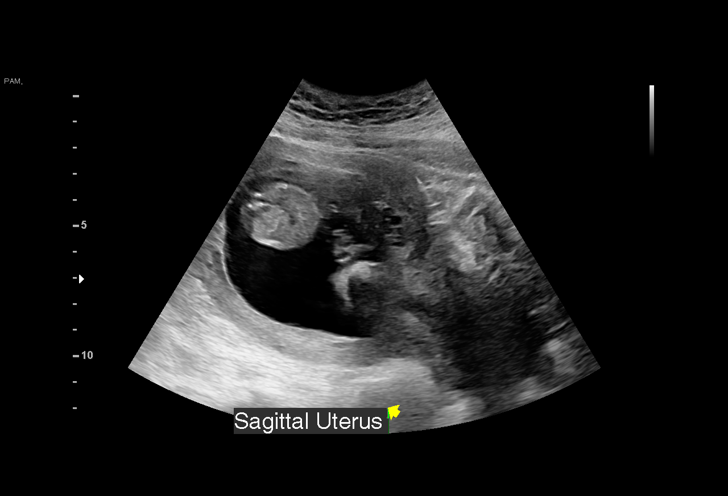
[im 3/41]
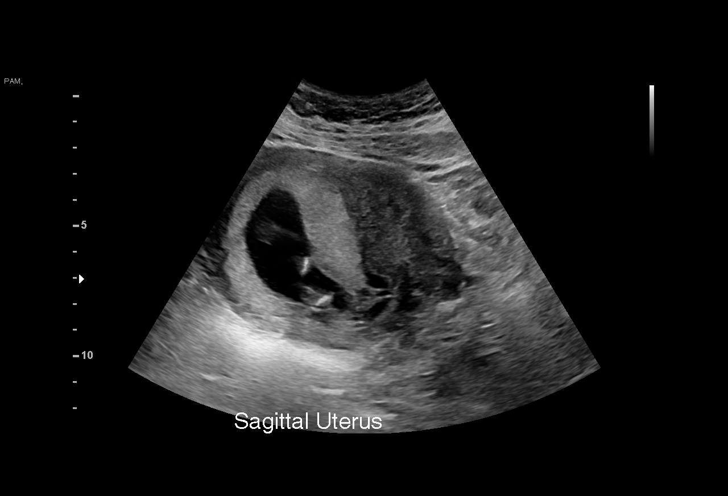
[im 6/41]
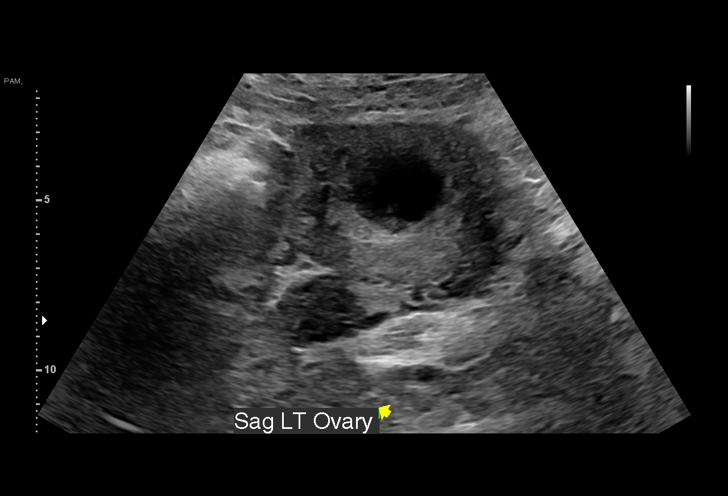
[im 9/41]
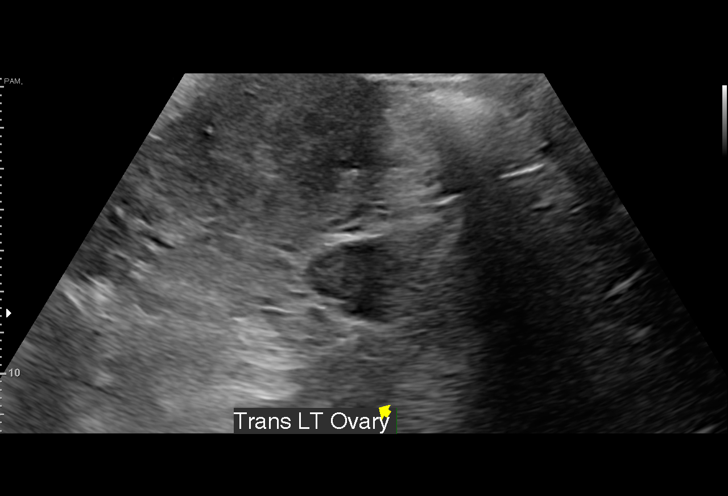
[im 12/41]
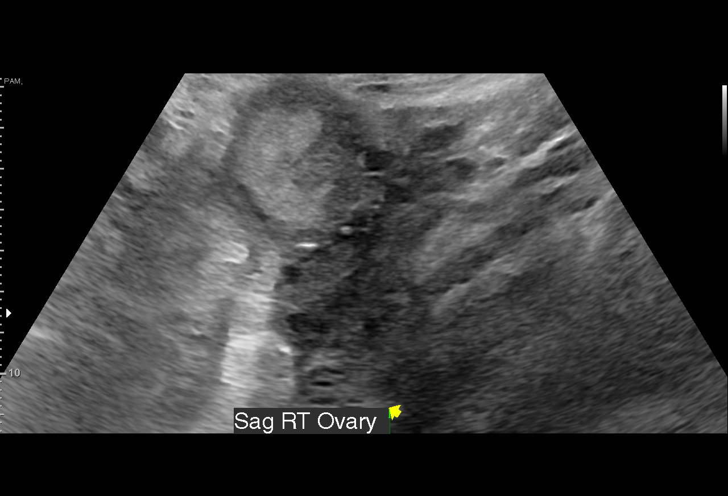
[im 15/41]
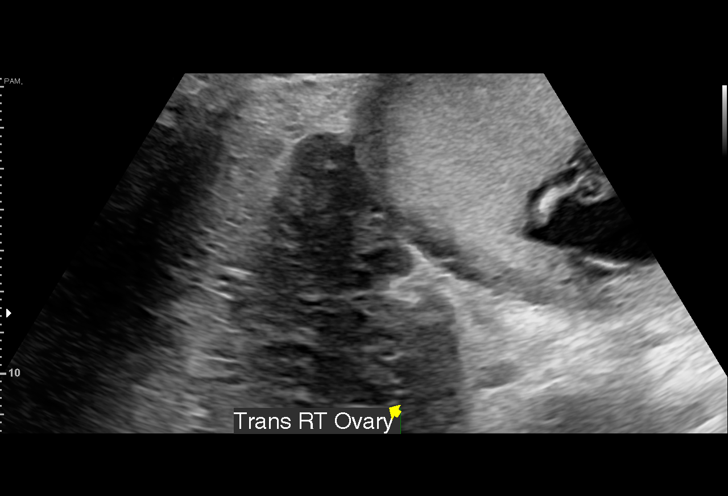
[im 18/41]
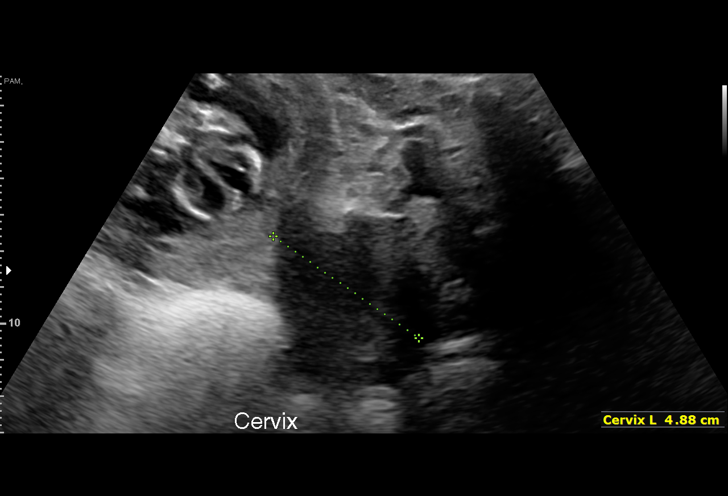
[im 21/41]
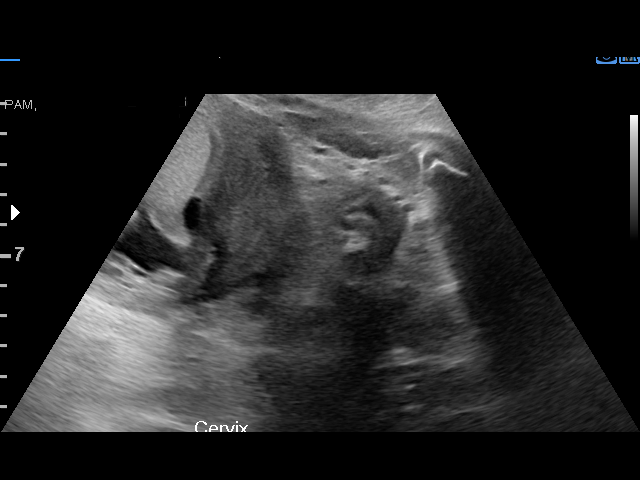
[im 23/41]
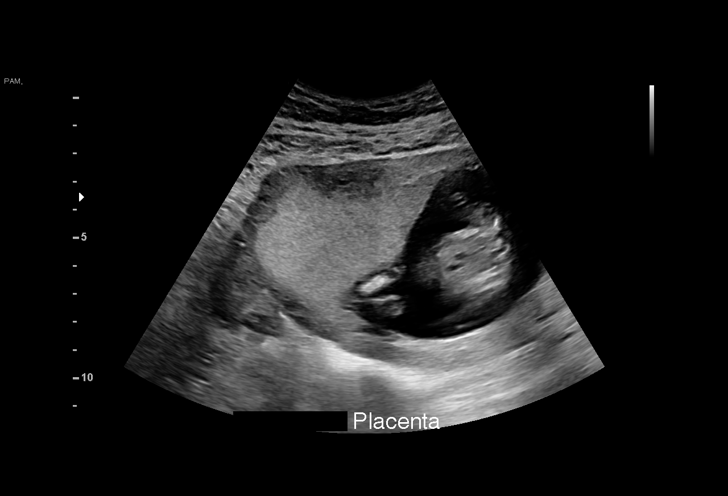
[im 26/41]
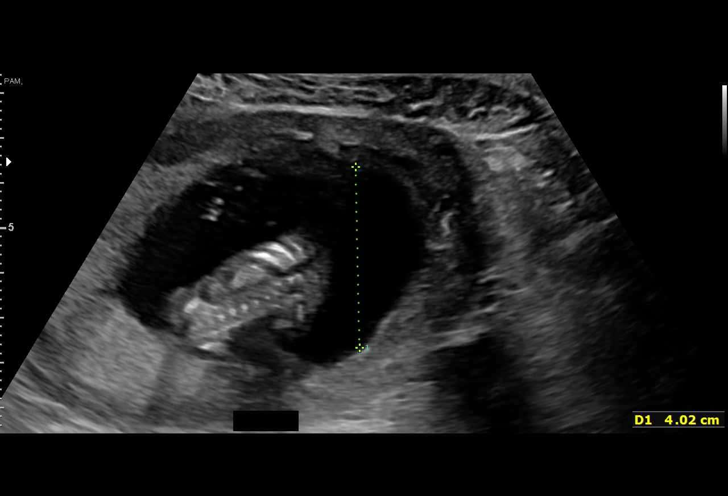
[im 29/41]
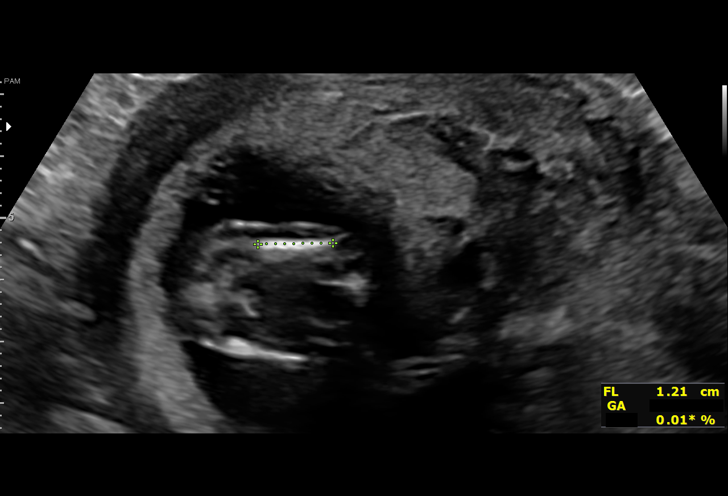
[im 32/41]
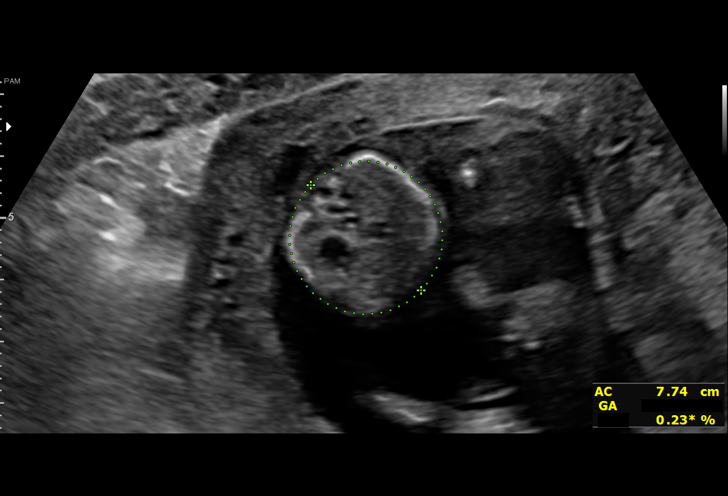
[im 35/41]
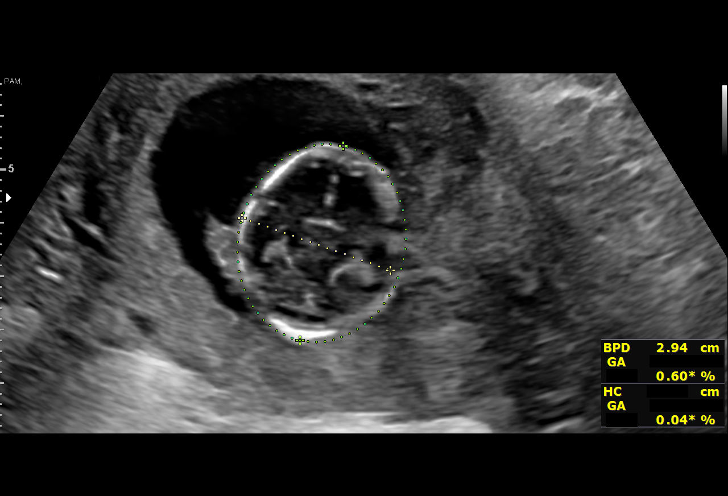
[im 38/41]
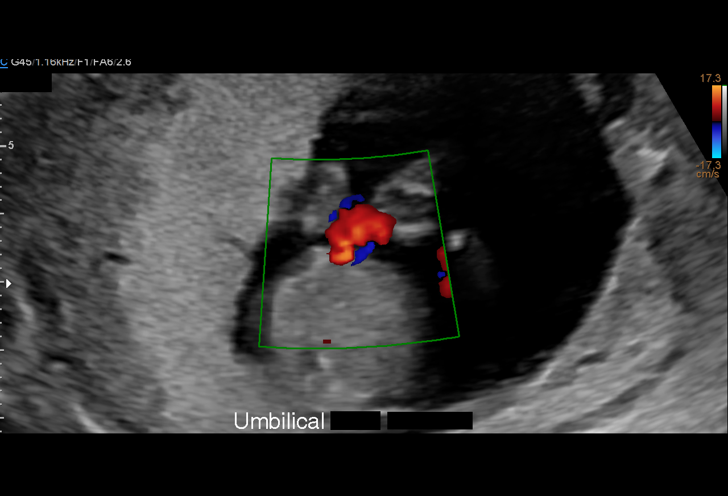
[im 41/41]
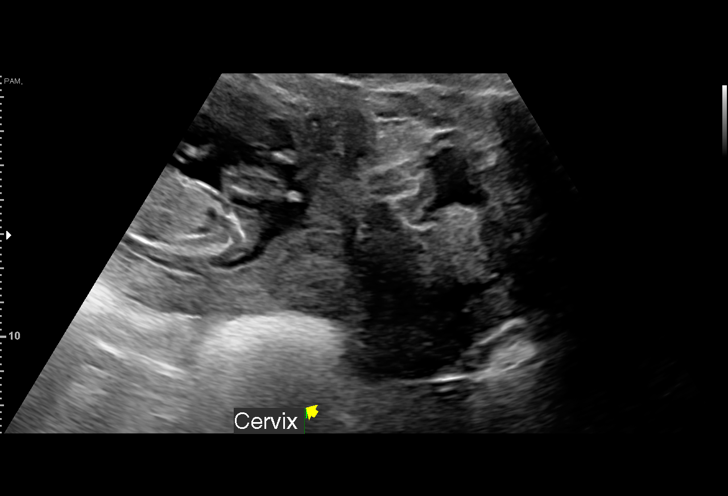

[15 of 28 positions shown; findings below may reference images not displayed]

1  US MFM OB LIMITED                     76815.01    DONTE GULL

Indications

 15 weeks gestation of pregnancy
 Vaginal bleeding in pregnancy, second
 trimester
Fetal Evaluation

 Num Of Fetuses:          1
 Fetal Heart Rate(bpm):   157
 Cardiac Activity:        Observed
 Presentation:            Transverse, head to maternal left
 Placenta:                Right lateral

 Amniotic Fluid
 AFI FV:      Within normal limits
Biometry

 BPD:      29.6  mm     G. Age:  15w 3d         63  %    CI:        74.44   %    70 - 86
                                                         FL/HC:       11.3  %    15.3 -
 HC:      108.9  mm     G. Age:  15w 2d         43  %    HC/AC:       1.37       1.05 -
 AC:       79.4  mm     G. Age:  14w 2d         31  %    FL/BPD:      41.6  %
 FL:       12.3  mm     G. Age:  13w 4d        4.8  %    FL/AC:       15.5  %    20 - 24

 Est. FW:      90   gm     0 lb 3 oz    3.5  %
OB History

 Gravidity:    3         Term:   2
 Living:       2
Gestational Age

 LMP:           17w 2d        Date:  05/29/20                 EDD:   03/05/21
 U/S Today:     14w 5d                                        EDD:   03/23/21
 Best:          15w 0d     Det. By:  Previous Ultrasound      EDD:   03/21/21
                                     (09/08/20)
Anatomy

 Stomach:               Appears normal, left   Kidneys:                Appear normal
                        sided
 Abdominal Wall:        Appears nml (cord      Bladder:                Appears normal
                        insert, abd wall)
Cervix Uterus Adnexa

 Cervix
 Length:           4.88  cm.
 Normal appearance by transabdominal scan.

 Right Ovary
 Within normal limits.

 Left Ovary
 Within normal limits.
Impression

 Limited exam for vaginal bleeding
 Single intrauterine pregnancy observed.
 No evidence of subchorionic bleeding observed
 Normal amniotic fluid
Recommendations

 Clinical correlation recommended.
 Consider a detailed examination at 18-20 weeks.

## 2022-01-07 ENCOUNTER — Ambulatory Visit (INDEPENDENT_AMBULATORY_CARE_PROVIDER_SITE_OTHER): Payer: Medicaid Other | Admitting: Obstetrics and Gynecology

## 2022-01-07 ENCOUNTER — Encounter: Payer: Self-pay | Admitting: Obstetrics and Gynecology

## 2022-01-07 ENCOUNTER — Ambulatory Visit: Payer: Medicaid Other | Admitting: Obstetrics

## 2022-01-07 DIAGNOSIS — Z01419 Encounter for gynecological examination (general) (routine) without abnormal findings: Secondary | ICD-10-CM

## 2022-01-07 HISTORY — DX: Encounter for gynecological examination (general) (routine) without abnormal findings: Z01.419

## 2022-01-07 NOTE — Progress Notes (Signed)
Pt is having irregular bleeding with OCP.  Pt states this is the first time this has happened with OCP use.

## 2022-01-07 NOTE — Patient Instructions (Signed)

## 2022-01-07 NOTE — Progress Notes (Signed)
Debra Wyatt is a 36 y.o. G45P3003 female here for a routine annual gynecologic exam.  Current complaints: Last month has some BTB with OCP's, but she did miss some pills..   Denies abnormal vaginal bleeding, discharge, pelvic pain, problems with intercourse or other gynecologic concerns.    Gynecologic History Patient's last menstrual period was 12/30/2021. Contraception: OCP (estrogen/progesterone) Last Pap: 2022. Results were: normal Last mammogram: NA.   Obstetric History OB History  Gravida Para Term Preterm AB Living  3 3 3     3   SAB IAB Ectopic Multiple Live Births        0 3    # Outcome Date GA Lbr Len/2nd Weight Sex Delivery Anes PTL Lv  3 Term 03/12/21 [redacted]w[redacted]d 06:26 / 05:50 9 lb 9 oz (4.338 kg) M Vag-Spont EPI  LIV  2 Term 01/23/16 [redacted]w[redacted]d 02:22 / 00:36 9 lb 4.8 oz (4.218 kg) F Vag-Spont None  LIV  1 Term 09/28/08 [redacted]w[redacted]d  7 lb 6 oz (3.345 kg) M Vag-Spont EPI  LIV    Past Medical History:  Diagnosis Date   Eczema    Gestational diabetes    Headache    Medical history non-contributory    Ovarian cyst    UTI (urinary tract infection)     Past Surgical History:  Procedure Laterality Date   TONSILLECTOMY     WISDOM TOOTH EXTRACTION      Current Outpatient Medications on File Prior to Visit  Medication Sig Dispense Refill   norgestimate-ethinyl estradiol (ORTHO-CYCLEN) 0.25-35 MG-MCG tablet Take 1 tablet by mouth daily. 28 tablet 11   ibuprofen (ADVIL) 800 MG tablet Take 1 tablet (800 mg total) by mouth every 8 (eight) hours as needed. 30 tablet 5   Prenat-Fe Poly-Methfol-FA-DHA (VITAFOL ULTRA) 29-0.6-0.4-200 MG CAPS Take 1 capsule by mouth daily before breakfast. 90 capsule 3   No current facility-administered medications on file prior to visit.    No Known Allergies  Social History   Socioeconomic History   Marital status: Single    Spouse name: Not on file   Number of children: 2   Years of education: Not on file   Highest education level: Not on file   Occupational History   Not on file  Tobacco Use   Smoking status: Never   Smokeless tobacco: Never  Vaping Use   Vaping Use: Never used  Substance and Sexual Activity   Alcohol use: Not Currently    Comment: occ   Drug use: No   Sexual activity: Yes    Partners: Male    Birth control/protection: Pill  Other Topics Concern   Not on file  Social History Narrative   Not on file   Social Determinants of Health   Financial Resource Strain: Not on file  Food Insecurity: No Food Insecurity (02/04/2021)   Hunger Vital Sign    Worried About Running Out of Food in the Last Year: Never true    Ran Out of Food in the Last Year: Never true  Transportation Needs: No Transportation Needs (02/11/2021)   PRAPARE - 04/13/2021 (Medical): No    Lack of Transportation (Non-Medical): No  Physical Activity: Not on file  Stress: Not on file  Social Connections: Not on file  Intimate Partner Violence: Not on file    Family History  Problem Relation Age of Onset   Healthy Mother    Hypertension Father    Asthma Brother    Cancer Maternal Grandfather  Diabetes Paternal Grandmother    Diabetes Paternal Grandfather     The following portions of the patient's history were reviewed and updated as appropriate: allergies, current medications, past family history, past medical history, past social history, past surgical history and problem list.  Review of Systems Pertinent items noted in HPI and remainder of comprehensive ROS otherwise negative.   Objective:  BP 122/84   Pulse 76   Ht 5\' 3"  (1.6 m)   Wt 200 lb (90.7 kg)   LMP 12/30/2021   BMI 35.43 kg/m  CONSTITUTIONAL: Well-developed, well-nourished female in no acute distress.  HENT:  Normocephalic, atraumatic, External right and left ear normal. Oropharynx is clear and moist EYES: Conjunctivae and EOM are normal. Pupils are equal, round, and reactive to light. No scleral icterus.  NECK: Normal range of  motion, supple, no masses.  Normal thyroid.  SKIN: Skin is warm and dry. No rash noted. Not diaphoretic. No erythema. No pallor. NEUROLGIC: Alert and oriented to person, place, and time. Normal reflexes, muscle tone coordination. No cranial nerve deficit noted. PSYCHIATRIC: Normal mood and affect. Normal behavior. Normal judgment and thought content. CARDIOVASCULAR: Normal heart rate noted, regular rhythm RESPIRATORY: Clear to auscultation bilaterally. Effort and breath sounds normal, no problems with respiration noted. BREASTS: Deferred  ABDOMEN: Soft, normal bowel sounds, no distention noted.  No tenderness, rebound or guarding.  PELVIC: Deferred MUSCULOSKELETAL: Normal range of motion. No tenderness.  No cyanosis, clubbing, or edema.  2+ distal pulses.   Assessment:  Annual gynecologic examination  BTB with OCP's Plan:  Importance of taking OCP's daily reviewed with pt Will follow cycles for now  Routine preventative health maintenance measures emphasized. Please refer to After Visit Summary for other counseling recommendations.    01/01/2022, MD, FACOG Attending Obstetrician & Gynecologist Center for [facility], [facility]

## 2022-05-01 IMAGING — US US MFM FETAL BPP W/O NON-STRESS
1 series · 13 of 28 positions shown · non-contrast
Comparison: none

[Series 1: us mfm fetal bpp w/o non-stress · 33 acquisitions, 13 frames shown]
[im 2/33]
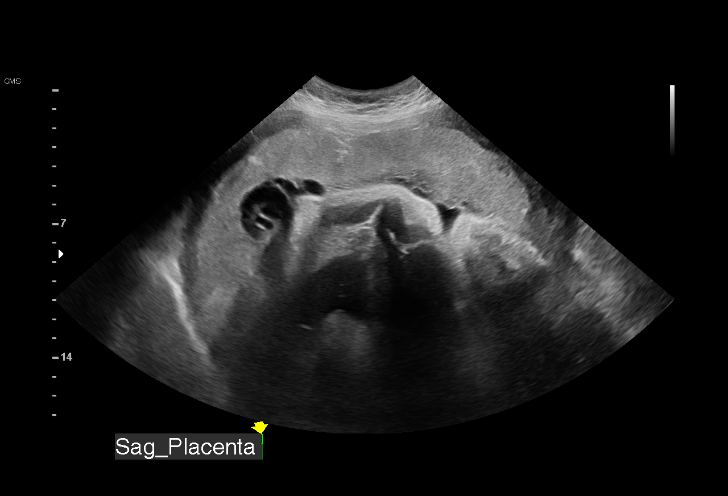
[im 4/33]
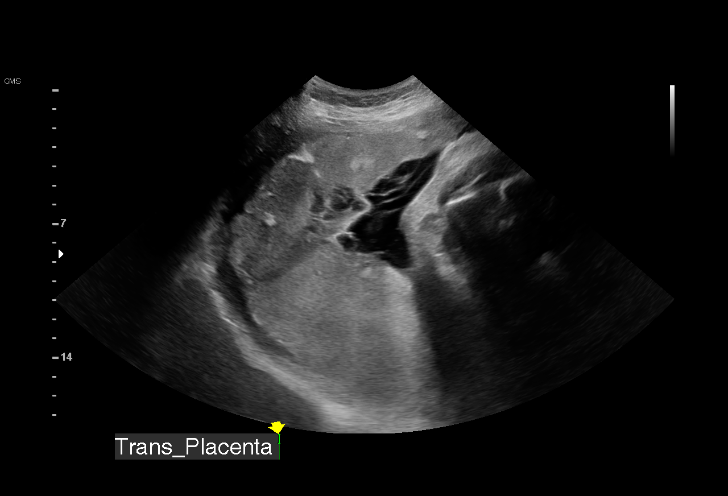
[im 6/33]
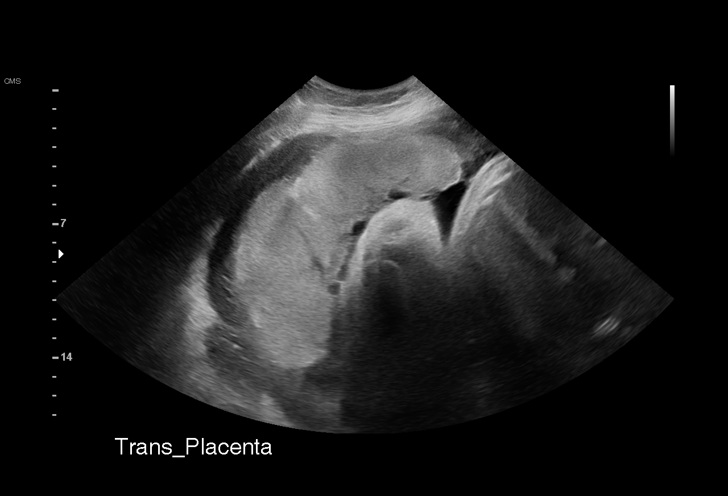
[im 9/33]
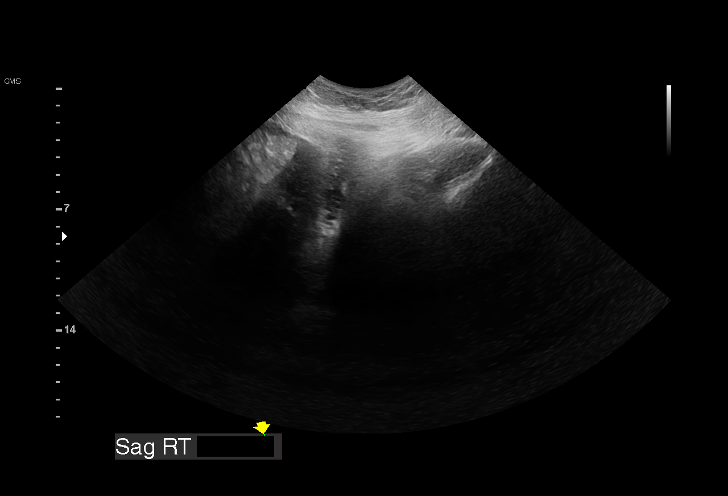
[im 11/33]
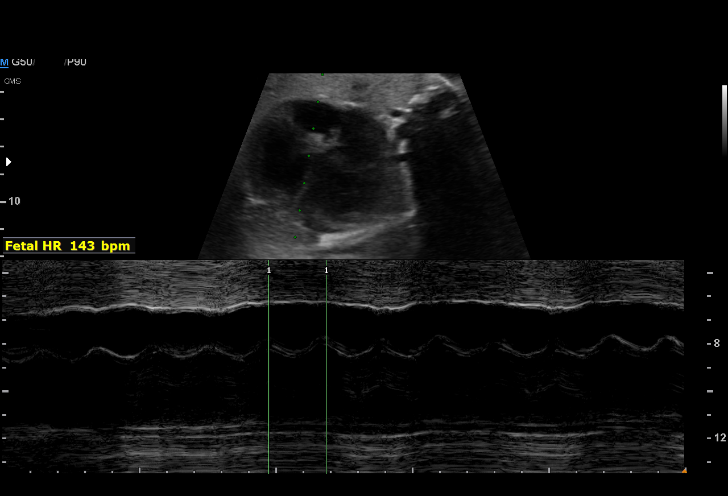
[im 14/33]
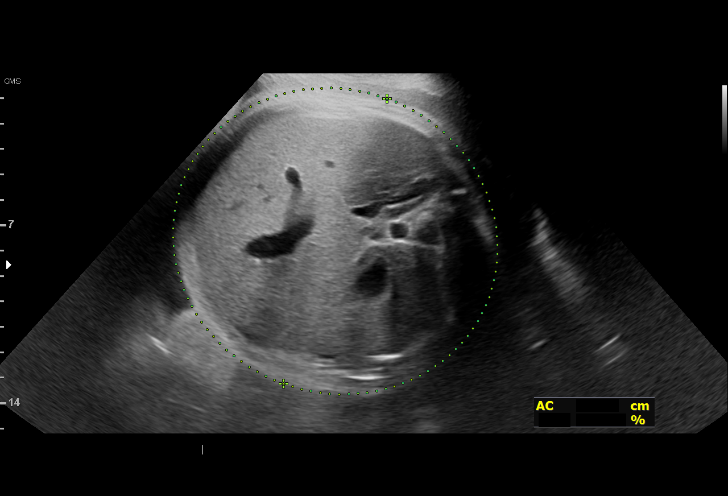
[im 17/33]
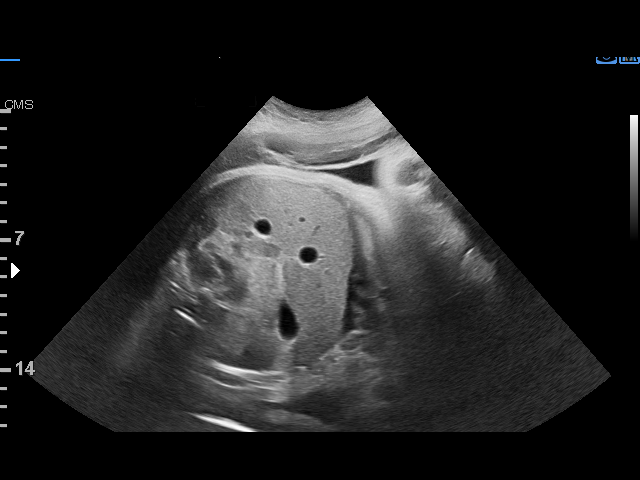
[im 19/33]
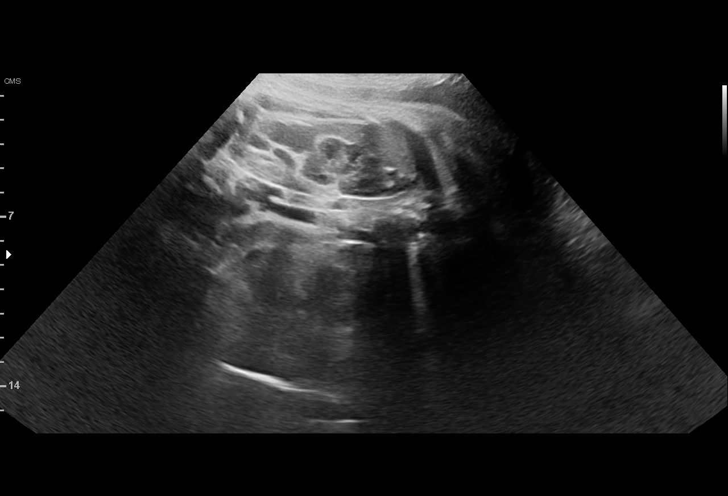
[im 22/33]
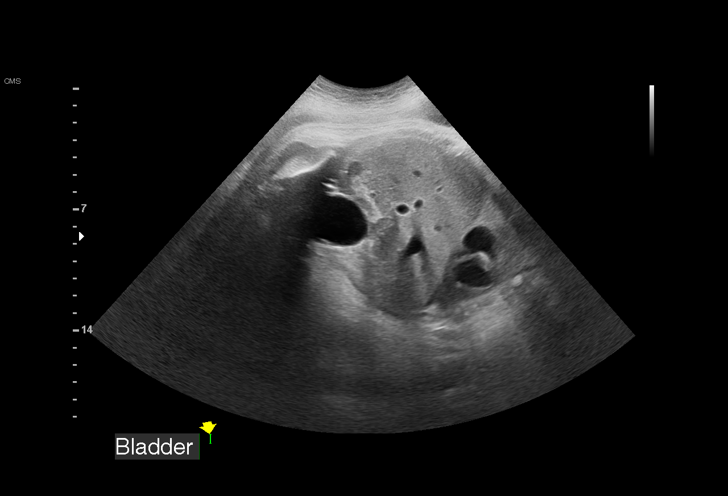
[im 24/33]
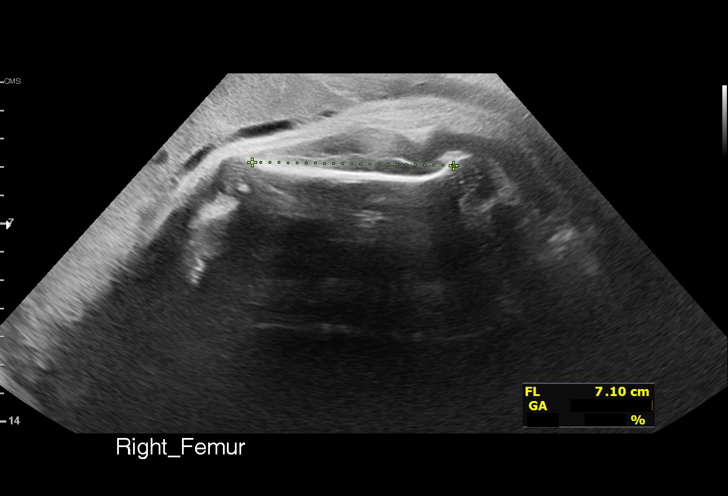
[im 27/33]
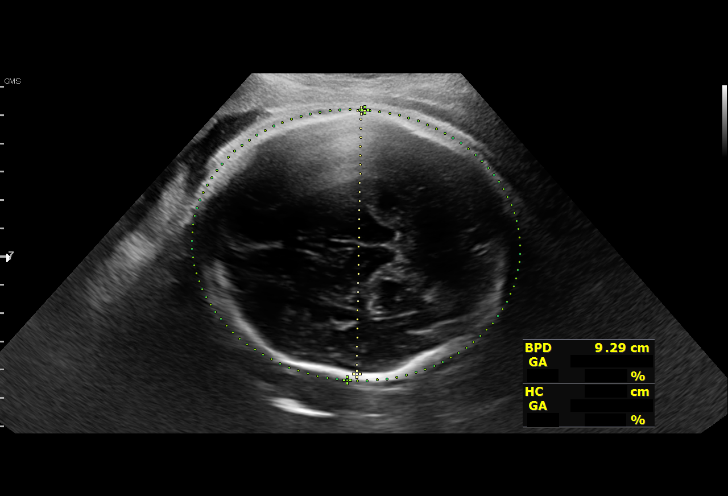
[im 29/33]
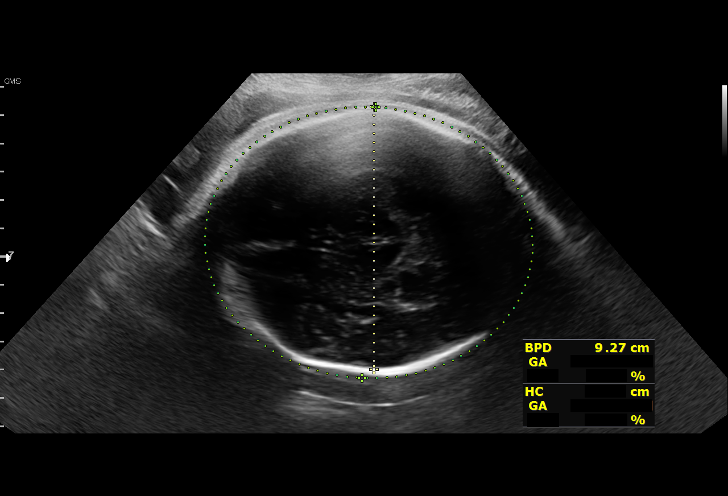
[im 31/33]
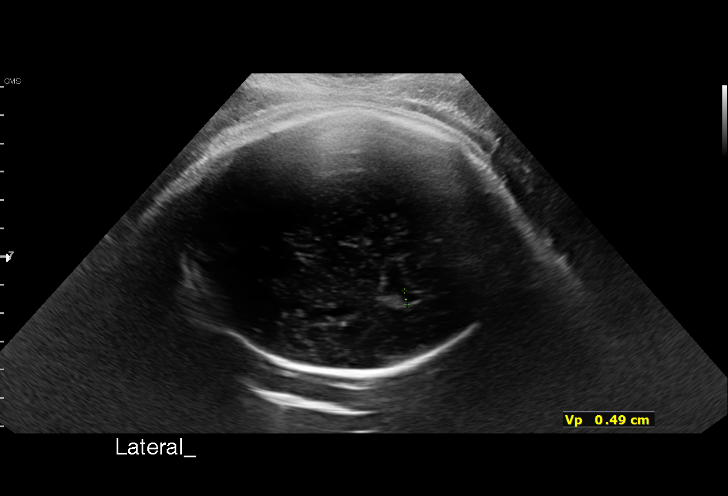

[13 of 28 positions shown; findings below may reference images not displayed]

Maximiliano,                             [HOSPITAL] at

Indications

 Gestational diabetes in pregnancy,
 controlled by oral hypoglycemic drugs
 Marginal insertion of umbilical cord affecting
 management of mother in second trimester
 Encounter for antenatal screening for
 malformations
 Obesity complicating pregnancy, second
 trimester (BMI 35)
 LR NIPS,  Negative AFP,  Negative Horizon
 37 weeks gestation of pregnancy
Fetal Evaluation

 Num Of Fetuses:         1
 Fetal Heart Rate(bpm):  143
 Cardiac Activity:       Observed
 Presentation:           Cephalic
 Placenta:               Right lateral
 P. Cord Insertion:      Previously Visualized

 Amniotic Fluid
 AFI FV:      Within normal limits

 AFI Sum(cm)     %Tile       Largest Pocket(cm)
 15.3            58
 RUQ(cm)       RLQ(cm)       LUQ(cm)        LLQ(cm)
 4.4           2             4
Biophysical Evaluation

 Amniotic F.V:   Pocket => 2 cm             F. Tone:        Observed
 F. Movement:    Observed                   Score:          [DATE]
 F. Breathing:   Observed
Biometry

 BPD:      92.6  mm     G. Age:  37w 4d         72  %    CI:        77.49   %    70 - 86
                                                         FL/HC:      21.1   %    20.9 -
 HC:       333   mm     G. Age:  38w 0d         38  %    HC/AC:      0.85        0.92 -
 AC:      390.6  mm     G. Age:  43w 0d       > 99  %    FL/BPD:     75.8   %    71 - 87
 FL:       70.2  mm     G. Age:  36w 0d         15  %    FL/AC:      18.0   %    20 - 24

 LV:        4.9  mm

 Est. FW:    6729  gm      9 lb 1 oz   > 99  %
OB History

 Gravidity:    3         Term:   2
 Living:       2
Gestational Age

 LMP:           39w 6d        Date:  05/29/20                 EDD:   03/05/21
 U/S Today:     38w 5d                                        EDD:   03/13/21
 Best:          37w 4d     Det. By:  Previous Ultrasound      EDD:   03/21/21
                                     (09/08/20)
Anatomy

 Cranium:               Appears normal         Aortic Arch:            Previously seen
 Cavum:                 Appears normal         Ductal Arch:            Previously seen
 Ventricles:            Appears normal         Diaphragm:              Appears normal
 Choroid Plexus:        Previously seen        Stomach:                Appears normal, left
                                                                       sided
 Cerebellum:            Previously seen        Abdomen:                Previously seen
 Posterior Fossa:       Previously seen        Abdominal Wall:         Previously seen
 Nuchal Fold:           Previously seen        Cord Vessels:           Previously seen
 Face:                  Orbits and profile     Kidneys:                Appear normal
                        previously seen
 Lips:                  Previously seen        Bladder:                Appears normal
 Thoracic:              Previously seen        Spine:                  Previously seen
 Heart:                 Appears normal         Upper Extremities:      Previously seen
                        (4CH, axis, and
                        situs)
 RVOT:                  Previously seen        Lower Extremities:      Previously seen
 LVOT:                  Previously seen

 Other:  Male gender previously seen. Heels previously visualized. Nasal bone
         previously visualized. Technically difficult due to maternal habitus and
         fetal position.
Cervix Uterus Adnexa
 Cervix
 Not visualized (advanced GA >14wks)

 Uterus
 No abnormality visualized.

 Right Ovary
 Not visualized.

 Left Ovary
 Not visualized.

 Adnexa
 No adnexal mass visualized.
Comments

 This patient was seen for a follow up growth scan and BPP
 due to gestational diabetes that is treated with metformin.
 The patient reports that she delivered her last child who
 weighed 9-1/2 pounds in 20 minutes without any perineal
 lacerations and without an epidural.  She reports feeling fetal
 movements throughout the day.
 She was informed that the fetal growth measures large for
 her gestational age (9 pounds 1 ounces, 99th percentile).
 There was normal amniotic fluid noted.
 A BPP performed today was [DATE].
 She is already scheduled for delivery on March 14, 2021
 (next week).
 She already has a another NST/BPP scheduled in your office
 next week.

## 2022-05-10 NOTE — L&D Delivery Note (Signed)
OB/GYN Faculty Practice Delivery Note  Debra Wyatt is a 37 y.o. [id number] s/p SVD at [redacted]w[redacted]d. She was admitted for SOL.   ROM: 2h 22m with  fluid GBS Status:  Negative/-- (09/10 1533) Maximum Maternal Temperature: 98.72F  Labor Progress: Initial SVE: 9/90/0. She then progressed to complete.   Delivery Date/Time: 10/3 @ [phone] Delivery: Called to room and patient was complete and pushing. Head delivered OA but only to the chin, shoulder called and asked for Dr. Alvester Morin to come to bedside.  Was able to get head delivered and restituted to maternal R.  Was not able to deliver anterior shoulder despite McRoberts positioning. Tried corkscrew method but was still unsuccessful.  Dr. Alvester Morin at bedside by 45secs of shoulder dystocia.  She was able to deliver posterior LUE of infant to deliver anterior shoulder.  No nuchal cord present.  Shoulder and body delivered in usual fashion. Total time of shoulder dystocia 46min17sec. Infant without spontaneous cry, placed on mother's abdomen, dried and stimulated. Cord clamped x 2 immediately, and cut by myself. Cord blood drawn. Placenta delivered spontaneously with gentle cord traction. Fundus firm with massage and Pitocin. Labia, perineum, vagina, and cervix inspected without laceration. Baby with humerus fracture and discussed with family but otherwise doing well.  Mom doing well.    Baby Weight: pending  Placenta: 3 vessel, intact. Sent to L&D Complications: 55min17sec shoulder dystoica Lacerations: None EBL: 590 mL Analgesia: None    Infant:  APGAR (1 MIN): 7  APGAR (5 MINS): 9   [name], MD [facility] Family Medicine Fellow, [facility] for [facility], [facility] 02/10/2023, 10:10 AM

## 2022-07-08 ENCOUNTER — Ambulatory Visit (INDEPENDENT_AMBULATORY_CARE_PROVIDER_SITE_OTHER): Payer: Medicaid Other

## 2022-07-08 VITALS — BP 133/87 | HR 87 | Ht 63.0 in | Wt 202.3 lb

## 2022-07-08 DIAGNOSIS — N912 Amenorrhea, unspecified: Secondary | ICD-10-CM

## 2022-07-08 DIAGNOSIS — Z0000 Encounter for general adult medical examination without abnormal findings: Secondary | ICD-10-CM

## 2022-07-08 DIAGNOSIS — O219 Vomiting of pregnancy, unspecified: Secondary | ICD-10-CM

## 2022-07-08 DIAGNOSIS — Z1339 Encounter for screening examination for other mental health and behavioral disorders: Secondary | ICD-10-CM

## 2022-07-08 LAB — POCT URINE PREGNANCY: Preg Test, Ur: POSITIVE — AB

## 2022-07-08 MED ORDER — VITAFOL ULTRA 29-0.6-0.4-200 MG PO CAPS: 1.0000 | ORAL_CAPSULE | Freq: Every day | ORAL | 3 refills | Status: DC

## 2022-07-08 MED ORDER — PROMETHAZINE HCL 25 MG PO TABS: 25.0000 mg | ORAL_TABLET | Freq: Four times a day (QID) | ORAL | 2 refills | Status: DC | PRN

## 2022-07-08 NOTE — Progress Notes (Signed)
Debra Wyatt presents today for UPT. She has no unusual complaints. LMP: 05/06/22 Patient is 13w0dEDD 02/10/23    OBJECTIVE: Appears well, in no apparent distress.  OB History     Gravida  3   Para  3   Term  3   Preterm      AB      Living  3      SAB      IAB      Ectopic      Multiple  0   Live Births  3          Home UPT Result: Positive In-Office UPT result:Positive I have reviewed the patient's medical, obstetrical, social, and family histories, and medications.   ASSESSMENT: Positive pregnancy test  PLAN Prenatal care to be completed at: [facility]

## 2022-07-13 ENCOUNTER — Ambulatory Visit (INDEPENDENT_AMBULATORY_CARE_PROVIDER_SITE_OTHER): Payer: Medicaid Other

## 2022-07-13 ENCOUNTER — Other Ambulatory Visit (HOSPITAL_COMMUNITY)
Admission: RE | Admit: 2022-07-13 | Discharge: 2022-07-13 | Disposition: A | Discharge status: Home / Self Care | Payer: Medicaid Other | Source: Ambulatory Visit | Attending: Obstetrics and Gynecology | Admitting: Obstetrics and Gynecology

## 2022-07-13 VITALS — BP 128/85 | HR 73 | Ht 63.0 in | Wt 198.4 lb

## 2022-07-13 DIAGNOSIS — Z3480 Encounter for supervision of other normal pregnancy, unspecified trimester: Secondary | ICD-10-CM | POA: Diagnosis not present

## 2022-07-13 DIAGNOSIS — Z3481 Encounter for supervision of other normal pregnancy, first trimester: Secondary | ICD-10-CM

## 2022-07-13 DIAGNOSIS — O3680X0 Pregnancy with inconclusive fetal viability, not applicable or unspecified: Secondary | ICD-10-CM

## 2022-07-13 DIAGNOSIS — Z3A09 9 weeks gestation of pregnancy: Secondary | ICD-10-CM | POA: Diagnosis not present

## 2022-07-13 NOTE — Progress Notes (Signed)
New OB Intake  I connected with Debra Wyatt  on 07/13/22 at  1:15 PM EST by in person and verified that I am speaking with the correct person using two identifiers. Nurse is located at [facility] and pt is located at [place].  I discussed the limitations, risks, security and privacy concerns of performing an evaluation and management service by telephone and the availability of in person appointments. I also discussed with the patient that there may be a patient responsible charge related to this service. The patient expressed understanding and agreed to proceed.  I explained I am completing New OB Intake today. We discussed EDD of 02/10/23 that is based on LMP of 05/06/22. Pt is G4/P3003. I reviewed her allergies, medications, Medical/Surgical/OB history, and appropriate screenings. I informed her of [facility] services. [facility] information placed in AVS. Based on history, this is a low risk pregnancy.  Patient Active Problem List   Diagnosis Date Noted   Visit for routine gyn exam 01/07/2022   History of gestational diabetes 05/07/2021   BMI 40.0-44.9, adult ([place]) 02/25/2021    Concerns addressed today  Delivery Plans Plans to deliver at [facility]. Patient given information for [facility] Healthy Baby website for more information about Women's and [place]. Patient is not interested in water birth. Offered upcoming OB visit with CNM to discuss further.  MyChart/Babyscripts MyChart access verified. I explained pt will have some visits in office and some virtually. Babyscripts instructions given and order placed. Patient verifies receipt of registration text/e-mail. Account successfully created and app downloaded.  Blood Pressure Cuff/Weight Scale Patient still has access to BP cuff from previous pregnancy. Explained after first prenatal appt pt will check weekly and document in 66. Patient does have weight scale.  Anatomy US Explained first scheduled [place] will be around 19 weeks.  Dating and viability u/s performed today. Anatomy US TBD.  Labs Discussed Johnsie Cancel genetic screening with patient. Would like both Panorama and Horizon drawn at new OB visit. Routine prenatal labs needed.  COVID Vaccine Patient has not had COVID vaccine.   Is patient a CenteringPregnancy candidate?  Declined Declined due to Group setting Not a candidate due to  If accepted,    Social Determinants of Health Food Insecurity: Patient denies food insecurity. WIC Referral: Patient is interested in referral to [facility].  Transportation: Patient denies transportation needs. Childcare: Discussed no children allowed at ultrasound appointments. Offered childcare services; patient declines childcare services at this time.  Interested in [place]? If yes, send referral.   First visit review I reviewed new OB appt with patient. I explained they will have a provider visit that includes pap smear, genetic screening, and discuss plan of care for pregnancy. Explained pt will be seen by Baltazar Najjar at first visit; encounter routed to appropriate provider. Explained that patient will be seen by pregnancy navigator following visit with provider.   Debra Lei, RN 07/13/2022  1:16 PM

## 2022-07-14 LAB — CERVICOVAGINAL ANCILLARY ONLY
Chlamydia: NEGATIVE
Comment: NEGATIVE
Comment: NEGATIVE
Comment: NORMAL
Neisseria Gonorrhea: NEGATIVE
Trichomonas: NEGATIVE

## 2022-07-14 LAB — CBC/D/PLT+RPR+RH+ABO+RUBIGG...
Antibody Screen: NEGATIVE
Basophils Absolute: 0 10*3/uL (ref 0.0–0.2)
Basos: 0 %
EOS (ABSOLUTE): 0 10*3/uL (ref 0.0–0.4)
Eos: 0 %
HCV Ab: NONREACTIVE
HIV Screen 4th Generation wRfx: NONREACTIVE
Hematocrit: 41.3 % (ref 34.0–46.6)
Hemoglobin: 14.1 g/dL (ref 11.1–15.9)
Hepatitis B Surface Ag: NEGATIVE
Immature Grans (Abs): 0 10*3/uL (ref 0.0–0.1)
Immature Granulocytes: 0 %
Lymphocytes Absolute: 2.2 10*3/uL (ref 0.7–3.1)
Lymphs: 26 %
MCH: 28.5 pg (ref 26.6–33.0)
MCHC: 34.1 g/dL (ref 31.5–35.7)
MCV: 83 fL (ref 79–97)
Monocytes Absolute: 0.5 10*3/uL (ref 0.1–0.9)
Monocytes: 6 %
Neutrophils Absolute: 5.9 10*3/uL (ref 1.4–7.0)
Neutrophils: 68 %
Platelets: 261 10*3/uL (ref 150–450)
RBC: 4.95 x10E6/uL (ref 3.77–5.28)
RDW: 13.1 % (ref 11.7–15.4)
RPR Ser Ql: NONREACTIVE
Rh Factor: POSITIVE
Rubella Antibodies, IGG: 1.19 index (ref >0.99)
WBC: 8.7 10*3/uL (ref 3.4–10.8)

## 2022-07-14 LAB — HCV INTERPRETATION

## 2022-07-15 LAB — CULTURE, OB URINE

## 2022-07-15 LAB — URINE CULTURE, OB REFLEX

## 2022-08-17 ENCOUNTER — Other Ambulatory Visit (HOSPITAL_COMMUNITY)
Admission: RE | Admit: 2022-08-17 | Discharge: 2022-08-17 | Disposition: A | Discharge status: Home / Self Care | Payer: Medicaid Other | Source: Ambulatory Visit | Attending: Obstetrics | Admitting: Obstetrics

## 2022-08-17 ENCOUNTER — Ambulatory Visit: Payer: Medicaid Other | Admitting: Obstetrics

## 2022-08-17 ENCOUNTER — Encounter: Payer: Self-pay | Admitting: Obstetrics

## 2022-08-17 VITALS — BP 125/82 | HR 80 | Wt 204.4 lb

## 2022-08-17 DIAGNOSIS — Z1339 Encounter for screening examination for other mental health and behavioral disorders: Secondary | ICD-10-CM | POA: Diagnosis not present

## 2022-08-17 DIAGNOSIS — Z3480 Encounter for supervision of other normal pregnancy, unspecified trimester: Secondary | ICD-10-CM | POA: Insufficient documentation

## 2022-08-17 DIAGNOSIS — O99210 Obesity complicating pregnancy, unspecified trimester: Secondary | ICD-10-CM

## 2022-08-17 DIAGNOSIS — Z3482 Encounter for supervision of other normal pregnancy, second trimester: Secondary | ICD-10-CM

## 2022-08-17 DIAGNOSIS — J301 Allergic rhinitis due to pollen: Secondary | ICD-10-CM

## 2022-08-17 DIAGNOSIS — Z3A14 14 weeks gestation of pregnancy: Secondary | ICD-10-CM

## 2022-08-17 MED ORDER — LORATADINE 10 MG PO TABS: 10.0000 mg | ORAL_TABLET | Freq: Every day | ORAL | 11 refills | Status: DC

## 2022-08-17 NOTE — Progress Notes (Signed)
Subjective:    Debra Wyatt is being seen today for her first obstetrical visit.  This is not a planned pregnancy. She is at [redacted]w[redacted]d gestation. Her obstetrical history is significant for  none . Relationship with FOB: significant other, living together. Patient does intend to breast feed. Pregnancy history fully reviewed.  The information documented in the HPI was reviewed and verified.  Menstrual History: OB History     Gravida  4   Para  3   Term  3   Preterm      AB      Living  3      SAB      IAB      Ectopic      Multiple  0   Live Births  3            Patient's last menstrual period was 05/06/2022.    Past Medical History:  Diagnosis Date   Eczema    Gestational diabetes    Headache    Medical history non-contributory    Ovarian cyst    UTI (urinary tract infection)     Past Surgical History:  Procedure Laterality Date   TONSILLECTOMY     WISDOM TOOTH EXTRACTION      (Not in a hospital admission)  No Known Allergies  Social History   Tobacco Use   Smoking status: Never   Smokeless tobacco: Never  Substance Use Topics   Alcohol use: Not Currently    Comment: occ, prior to pregnancy    Family History  Problem Relation Age of Onset   Healthy Mother    Hypertension Father    Asthma Brother    Cancer Maternal Grandfather    Diabetes Paternal Grandmother    Diabetes Paternal Grandfather      Review of Systems Constitutional: negative for weight loss Gastrointestinal: negative for vomiting Genitourinary:negative for genital lesions and vaginal discharge and dysuria Musculoskeletal:negative for back pain Behavioral/Psych: negative for abusive relationship, depression, illegal drug usage and tobacco use    Objective:    BP 125/82   Pulse 80   Wt 204 lb 6.4 oz (92.7 kg)   LMP 05/06/2022   BMI 36.21 kg/m  General Appearance:    Alert, cooperative, no distress, appears stated age  Head:    Normocephalic, without obvious  abnormality, atraumatic  Eyes:    PERRL, conjunctiva/corneas clear, EOM's intact, fundi    benign, both eyes  Ears:    Normal TM's and external ear canals, both ears  Nose:   Nares normal, septum midline, mucosa normal, no drainage    or sinus tenderness  Throat:   Lips, mucosa, and tongue normal; teeth and gums normal  Neck:   Supple, symmetrical, trachea midline, no adenopathy;    thyroid:  no enlargement/tenderness/nodules; no carotid   bruit or JVD  Back:     Symmetric, no curvature, ROM normal, no CVA tenderness  Lungs:     Clear to auscultation bilaterally, respirations unlabored  Chest Wall:    No tenderness or deformity   Heart:    Regular rate and rhythm, S1 and S2 normal, no murmur, rub   or gallop  Breast Exam:    No tenderness, masses, or nipple abnormality  Abdomen:     Soft, non-tender, bowel sounds active all four quadrants,    no masses, no organomegaly  Genitalia:    Normal female without lesion, discharge or tenderness  Extremities:   Extremities normal, atraumatic, no cyanosis or edema  Pulses:   2+ and symmetric all extremities  Skin:   Skin color, texture, turgor normal, no rashes or lesions  Lymph nodes:   Cervical, supraclavicular, and axillary nodes normal  Neurologic:   CNII-XII intact, normal strength, sensation and reflexes    throughout      Lab Review Urine pregnancy test Labs reviewed yes Radiologic studies reviewed no  Assessment:    Pregnancy at [redacted]w[redacted]d weeks    Plan:    1. Supervision of other normal pregnancy, antepartum Rx: - Panorama Prenatal Test Full Panel - Cytology - PAP( [place]) - [place] MFM OB DETAIL +14 WK; Future  2. Seasonal allergic rhinitis due to pollen Rx: - loratadine (CLARITIN) 10 MG tablet; Take 1 tablet (10 mg total) by mouth daily.  Dispense: 30 tablet; Refill: 11  3. Other obesity affecting pregnancy, antepartum    Prenatal vitamins.  Counseling provided regarding continued use of seat belts, cessation of alcohol  consumption, smoking or use of illicit drugs; infection precautions i.e., influenza/TDAP immunizations, toxoplasmosis,CMV, parvovirus, listeria and varicella; workplace safety, exercise during pregnancy; routine dental care, safe medications, sexual activity, hot tubs, saunas, pools, travel, caffeine use, fish and methlymercury, potential toxins, hair treatments, varicose veins Weight gain recommendations per IOM guidelines reviewed: underweight/BMI< 18.5--> gain 28 - 40 lbs; normal weight/BMI 18.5 - 24.9--> gain 25 - 35 lbs; overweight/BMI 25 - 29.9--> gain 15 - 25 lbs; obese/BMI >30->gain  11 - 20 lbs Problem list reviewed and updated. FIRST/CF mutation testing/NIPT/QUAD SCREEN/fragile X/Ashkenazi Jewish population testing/Spinal muscular atrophy discussed: requested. Role of ultrasound in pregnancy discussed; fetal survey: requested. Amniocentesis discussed: not indicated. VBAC calculator score: VBAC consent form provided No orders of the defined types were placed in this encounter.  No orders of the defined types were placed in this encounter.   Follow up in 2 weeks.  I have spent a total of 20 minutes of face-to-face time, excluding clinical staff time, reviewing notes and preparing to see patient, ordering tests and/or medications, and counseling the patient.   Brock Bad, MD 08/17/2022 3:55 PM

## 2022-08-17 NOTE — Progress Notes (Signed)
Pt presents for NOB. This is not a planned pregnancy and living with FOB.  Requests rx for seasonal allergies

## 2022-08-20 LAB — CYTOLOGY - PAP
Comment: NEGATIVE
Diagnosis: NEGATIVE
High risk HPV: NEGATIVE

## 2022-08-24 LAB — PANORAMA PRENATAL TEST FULL PANEL:PANORAMA TEST PLUS 5 ADDITIONAL MICRODELETIONS: FETAL FRACTION: 6.1

## 2022-09-14 ENCOUNTER — Ambulatory Visit (INDEPENDENT_AMBULATORY_CARE_PROVIDER_SITE_OTHER): Payer: Medicaid Other | Admitting: Advanced Practice Midwife

## 2022-09-14 ENCOUNTER — Encounter: Payer: Self-pay | Admitting: Advanced Practice Midwife

## 2022-09-14 VITALS — BP 124/81 | HR 79 | Wt 206.0 lb

## 2022-09-14 DIAGNOSIS — Z8632 Personal history of gestational diabetes: Secondary | ICD-10-CM

## 2022-09-14 DIAGNOSIS — Z3480 Encounter for supervision of other normal pregnancy, unspecified trimester: Secondary | ICD-10-CM

## 2022-09-14 DIAGNOSIS — O99210 Obesity complicating pregnancy, unspecified trimester: Secondary | ICD-10-CM

## 2022-09-14 DIAGNOSIS — E668 Other obesity: Secondary | ICD-10-CM

## 2022-09-14 MED ORDER — ASPIRIN 81 MG PO TBEC: 81.0000 mg | DELAYED_RELEASE_TABLET | Freq: Every day | ORAL | 12 refills | Status: DC

## 2022-09-14 NOTE — Progress Notes (Signed)
   PRENATAL VISIT NOTE  Subjective:  Debra Wyatt is a 37 y.o. G4P3003 at [redacted]w[redacted]d being seen today for ongoing prenatal care.  She is currently monitored for the following issues for this low-risk pregnancy and has BMI 40.0-44.9, adult (HCC); History of gestational diabetes; Visit for routine gyn exam; and Supervision of other normal pregnancy, antepartum on their problem list.  Patient reports no complaints.  Contractions: Not present. Vag. Bleeding: None.  Movement: Absent. Denies leaking of fluid.   The following portions of the patient's history were reviewed and updated as appropriate: allergies, current medications, past family history, past medical history, past social history, past surgical history and problem list.   Objective:   Vitals:   09/14/22 1605  BP: 124/81  Pulse: 79  Weight: 206 lb (93.4 kg)    Fetal Status: Fetal Heart Rate (bpm): 156   Movement: Absent     General:  Alert, oriented and cooperative. Patient is in no acute distress.  Skin: Skin is warm and dry. No rash noted.   Cardiovascular: Normal heart rate noted  Respiratory: Normal respiratory effort, no problems with respiration noted  Abdomen: Soft, gravid, appropriate for gestational age.  Pain/Pressure: Absent     Pelvic: Cervical exam deferred        Extremities: Normal range of motion.  Edema: None  Mental Status: Normal mood and affect. Normal behavior. Normal judgment and thought content.   Assessment and Plan:  Pregnancy: G4P3003 at [redacted]w[redacted]d 1. Supervision of other normal pregnancy, antepartum --Anticipatory guidance about next visits/weeks of pregnancy given.    - AFP, Serum, Open Spina Bifida  2. History of gestational diabetes --A1C today  3. Other obesity affecting pregnancy, antepartum --Start BASA daily - Hemoglobin A1c  Preterm labor symptoms and general obstetric precautions including but not limited to vaginal bleeding, contractions, leaking of fluid and fetal movement were  reviewed in detail with the patient. Please refer to After Visit Summary for other counseling recommendations.   Return in about 4 weeks (around 10/12/2022).  Future Appointments  Date Time Provider Department Center  09/21/2022 12:45 PM [facility] NURSE [facility]  09/21/2022  1:00 PM [facility] US1 [facility]  10/12/2022  1:50 PM Left[place]-Kirby, Wilmer Floor, CNM [facility] None    Sharen Counter, CNM

## 2022-09-15 LAB — HEMOGLOBIN A1C
Est. average glucose Bld gHb Est-mCnc: 105 mg/dL
Hgb A1c MFr Bld: 5.3 % (ref 4.8–5.6)

## 2022-09-16 LAB — AFP, SERUM, OPEN SPINA BIFIDA
AFP MoM: 0.8
AFP Value: 29.1 ng/mL
Gest. Age on Collection Date: 18 weeks
Maternal Age At EDD: 34.9 yr
OSBR Risk 1 IN: 10000
Test Results:: NEGATIVE
Weight: 206 [lb_av]

## 2022-09-21 ENCOUNTER — Ambulatory Visit: Payer: Medicaid Other | Admitting: *Deleted

## 2022-09-21 ENCOUNTER — Ambulatory Visit: Payer: Medicaid Other | Attending: Obstetrics

## 2022-09-21 VITALS — BP 111/65 | HR 87

## 2022-09-21 DIAGNOSIS — Z363 Encounter for antenatal screening for malformations: Secondary | ICD-10-CM | POA: Diagnosis not present

## 2022-09-21 DIAGNOSIS — O09299 Supervision of pregnancy with other poor reproductive or obstetric history, unspecified trimester: Secondary | ICD-10-CM | POA: Diagnosis not present

## 2022-09-21 DIAGNOSIS — Z3480 Encounter for supervision of other normal pregnancy, unspecified trimester: Secondary | ICD-10-CM | POA: Diagnosis not present

## 2022-09-21 DIAGNOSIS — O99212 Obesity complicating pregnancy, second trimester: Secondary | ICD-10-CM | POA: Insufficient documentation

## 2022-09-21 DIAGNOSIS — O09292 Supervision of pregnancy with other poor reproductive or obstetric history, second trimester: Secondary | ICD-10-CM | POA: Diagnosis not present

## 2022-09-21 DIAGNOSIS — Z3A19 19 weeks gestation of pregnancy: Secondary | ICD-10-CM | POA: Diagnosis not present

## 2022-09-22 ENCOUNTER — Other Ambulatory Visit: Payer: Self-pay

## 2022-09-22 DIAGNOSIS — O09292 Supervision of pregnancy with other poor reproductive or obstetric history, second trimester: Secondary | ICD-10-CM

## 2022-09-22 DIAGNOSIS — O99212 Obesity complicating pregnancy, second trimester: Secondary | ICD-10-CM

## 2022-10-12 ENCOUNTER — Ambulatory Visit (INDEPENDENT_AMBULATORY_CARE_PROVIDER_SITE_OTHER): Payer: Medicaid Other | Admitting: Advanced Practice Midwife

## 2022-10-12 VITALS — BP 120/74 | HR 94 | Wt 211.0 lb

## 2022-10-12 DIAGNOSIS — Z8632 Personal history of gestational diabetes: Secondary | ICD-10-CM

## 2022-10-12 DIAGNOSIS — Z3A22 22 weeks gestation of pregnancy: Secondary | ICD-10-CM

## 2022-10-12 DIAGNOSIS — Z3480 Encounter for supervision of other normal pregnancy, unspecified trimester: Secondary | ICD-10-CM

## 2022-10-12 DIAGNOSIS — Z3482 Encounter for supervision of other normal pregnancy, second trimester: Secondary | ICD-10-CM

## 2022-10-12 NOTE — Progress Notes (Signed)
Pt presents for ROB. Reports no concerns to RN at this time.

## 2022-10-12 NOTE — Progress Notes (Signed)
   PRENATAL VISIT NOTE  Subjective:  Debra Wyatt is a 37 y.o. G4P3003 at [redacted]w[redacted]d being seen today for ongoing prenatal care.  She is currently monitored for the following issues for this low-risk pregnancy and has BMI 40.0-44.9, adult (HCC); History of gestational diabetes; and Supervision of other normal pregnancy, antepartum on their problem list.  Patient reports no complaints.  Contractions: Not present. Vag. Bleeding: None.  Movement: Present. Denies leaking of fluid.   The following portions of the patient's history were reviewed and updated as appropriate: allergies, current medications, past family history, past medical history, past social history, past surgical history and problem list.   Objective:   Vitals:   10/12/22 1416 10/12/22 1421  BP:  120/74  Pulse:  94  Weight: 211 lb (95.7 kg) 211 lb (95.7 kg)    Fetal Status: Fetal Heart Rate (bpm): 151 Fundal Height: 23 cm Movement: Present     General:  Alert, oriented and cooperative. Patient is in no acute distress.  Skin: Skin is warm and dry. No rash noted.   Cardiovascular: Normal heart rate noted  Respiratory: Normal respiratory effort, no problems with respiration noted  Abdomen: Soft, gravid, appropriate for gestational age.  Pain/Pressure: Absent     Pelvic: Cervical exam deferred        Extremities: Normal range of motion.  Edema: None  Mental Status: Normal mood and affect. Normal behavior. Normal judgment and thought content.   Assessment and Plan:  Pregnancy: G4P3003 at [redacted]w[redacted]d 1. Supervision of other normal pregnancy, antepartum --Anticipatory guidance about next visits/weeks of pregnancy given.   2. History of gestational diabetes --GTT at next visit  3. [redacted] weeks gestation of pregnancy   Preterm labor symptoms and general obstetric precautions including but not limited to vaginal bleeding, contractions, leaking of fluid and fetal movement were reviewed in detail with the patient. Please refer to  After Visit Summary for other counseling recommendations.   Return in about 4 weeks (around 11/09/2022) for LOB, GTT at next visit.  Future Appointments  Date Time Provider Department Center  10/19/2022 11:30 AM [facility]  11/09/2022  9:15 AM [facility] None  11/09/2022 10:35 AM Leftwich-Kirby, Wilmer Floor, CNM [facility] None    Sharen Counter, CNM

## 2022-10-15 ENCOUNTER — Telehealth: Payer: Self-pay | Admitting: Obstetrics and Gynecology

## 2022-10-15 NOTE — Telephone Encounter (Signed)
Called to inform patient of r/s appt by order of Dr. Judeth Cornfield due to scheduling conflict.

## 2022-10-19 ENCOUNTER — Ambulatory Visit: Payer: Medicaid Other

## 2022-11-02 ENCOUNTER — Other Ambulatory Visit: Payer: Self-pay | Admitting: *Deleted

## 2022-11-02 ENCOUNTER — Ambulatory Visit: Payer: Medicaid Other | Attending: Maternal & Fetal Medicine

## 2022-11-02 DIAGNOSIS — E669 Obesity, unspecified: Secondary | ICD-10-CM | POA: Diagnosis not present

## 2022-11-02 DIAGNOSIS — O99212 Obesity complicating pregnancy, second trimester: Secondary | ICD-10-CM

## 2022-11-02 DIAGNOSIS — O09292 Supervision of pregnancy with other poor reproductive or obstetric history, second trimester: Secondary | ICD-10-CM

## 2022-11-02 DIAGNOSIS — Z8632 Personal history of gestational diabetes: Secondary | ICD-10-CM | POA: Diagnosis present

## 2022-11-02 DIAGNOSIS — O99213 Obesity complicating pregnancy, third trimester: Secondary | ICD-10-CM

## 2022-11-02 DIAGNOSIS — Z3A25 25 weeks gestation of pregnancy: Secondary | ICD-10-CM

## 2022-11-03 DIAGNOSIS — O99212 Obesity complicating pregnancy, second trimester: Secondary | ICD-10-CM | POA: Insufficient documentation

## 2022-11-09 ENCOUNTER — Ambulatory Visit (INDEPENDENT_AMBULATORY_CARE_PROVIDER_SITE_OTHER): Payer: Medicaid Other | Admitting: Advanced Practice Midwife

## 2022-11-09 ENCOUNTER — Other Ambulatory Visit: Payer: Medicaid Other

## 2022-11-09 ENCOUNTER — Encounter: Payer: Self-pay | Admitting: Advanced Practice Midwife

## 2022-11-09 VITALS — BP 117/71 | HR 89 | Wt 215.4 lb

## 2022-11-09 DIAGNOSIS — Z3A26 26 weeks gestation of pregnancy: Secondary | ICD-10-CM

## 2022-11-09 DIAGNOSIS — Z8632 Personal history of gestational diabetes: Secondary | ICD-10-CM

## 2022-11-09 DIAGNOSIS — Z3480 Encounter for supervision of other normal pregnancy, unspecified trimester: Secondary | ICD-10-CM

## 2022-11-09 NOTE — Progress Notes (Signed)
   PRENATAL VISIT NOTE  Subjective:  Debra Wyatt is a 37 y.o. G4P3003 at [redacted]w[redacted]d being seen today for ongoing prenatal care.  She is currently monitored for the following issues for this low-risk pregnancy and has History of gestational diabetes; Supervision of other normal pregnancy, antepartum; and Obesity affecting pregnancy in second trimester (pBMI 35.6) on their problem list.  Patient reports no complaints.  Contractions: Not present. Vag. Bleeding: None.  Movement: Present. Denies leaking of fluid.   The following portions of the patient's history were reviewed and updated as appropriate: allergies, current medications, past family history, past medical history, past social history, past surgical history and problem list.   Objective:   Vitals:   11/09/22 1043  BP: 117/71  Pulse: 89  Weight: 215 lb 6.4 oz (97.7 kg)    Fetal Status: Fetal Heart Rate (bpm): 159 Fundal Height: 26 cm Movement: Present     General:  Alert, oriented and cooperative. Patient is in no acute distress.  Skin: Skin is warm and dry. No rash noted.   Cardiovascular: Normal heart rate noted  Respiratory: Normal respiratory effort, no problems with respiration noted  Abdomen: Soft, gravid, appropriate for gestational age.  Pain/Pressure: Absent     Pelvic: Cervical exam deferred        Extremities: Normal range of motion.  Edema: Trace  Mental Status: Normal mood and affect. Normal behavior. Normal judgment and thought content.   Assessment and Plan:  Pregnancy: G4P3003 at [redacted]w[redacted]d 1. Supervision of other normal pregnancy, antepartum --Anticipatory guidance about next visits/weeks of pregnancy given.  - CBC - Glucose Tolerance, 2 Hours w/1 Hour - RPR - HIV Antibody (routine testing w rflx)  2. [redacted] weeks gestation of pregnancy   3. History of gestational diabetes    Preterm labor symptoms and general obstetric precautions including but not limited to vaginal bleeding, contractions, leaking of  fluid and fetal movement were reviewed in detail with the patient. Please refer to After Visit Summary for other counseling recommendations.   Return in about 2 weeks (around 11/23/2022) for LOB.  Future Appointments  Date Time Provider Department Center  11/24/2022  2:30 PM Sue Lush, FNP [facility] None  12/07/2022  1:30 PM [facility] US3 [facility]    Sharen Counter, CNM

## 2022-11-09 NOTE — Progress Notes (Signed)
Pt presents for ROB visit. No concerns.  

## 2022-11-10 LAB — CBC
Hematocrit: 34.5 % (ref 34.0–46.6)
Hemoglobin: 11.7 g/dL (ref 11.1–15.9)
MCH: 29.7 pg (ref 26.6–33.0)
MCHC: 33.9 g/dL (ref 31.5–35.7)
MCV: 88 fL (ref 79–97)
Platelets: 187 10*3/uL (ref 150–450)
RBC: 3.94 x10E6/uL (ref 3.77–5.28)
RDW: 13.1 % (ref 11.7–15.4)
WBC: 9.1 10*3/uL (ref 3.4–10.8)

## 2022-11-10 LAB — GLUCOSE TOLERANCE, 2 HOURS W/ 1HR
Glucose, 1 hour: 142 mg/dL (ref 70–179)
Glucose, 2 hour: 134 mg/dL (ref 70–152)
Glucose, Fasting: 91 mg/dL (ref 70–91)

## 2022-11-10 LAB — SYPHILIS: RPR W/REFLEX TO RPR TITER AND TREPONEMAL ANTIBODIES, TRADITIONAL SCREENING AND DIAGNOSIS ALGORITHM: RPR Ser Ql: NONREACTIVE

## 2022-11-10 LAB — HIV ANTIBODY (ROUTINE TESTING W REFLEX): HIV Screen 4th Generation wRfx: NONREACTIVE

## 2022-11-24 ENCOUNTER — Ambulatory Visit: Payer: Medicaid Other | Admitting: Student

## 2022-11-24 VITALS — BP 121/78 | HR 94 | Wt 215.5 lb

## 2022-11-24 DIAGNOSIS — Z3480 Encounter for supervision of other normal pregnancy, unspecified trimester: Secondary | ICD-10-CM

## 2022-11-24 DIAGNOSIS — Z3A28 28 weeks gestation of pregnancy: Secondary | ICD-10-CM

## 2022-11-24 DIAGNOSIS — Z8632 Personal history of gestational diabetes: Secondary | ICD-10-CM

## 2022-11-24 NOTE — Progress Notes (Signed)
   PRENATAL VISIT NOTE  Subjective:  Debra Wyatt is a 37 y.o. G4P3003 at [redacted]w[redacted]d being seen today for ongoing prenatal care.  She is currently monitored for the following issues for this low-risk pregnancy and has History of gestational diabetes; Supervision of other normal pregnancy, antepartum; and Obesity affecting pregnancy in second trimester (pBMI 35.6) on their problem list.  Patient reports no complaints.  Contractions: Not present. Vag. Bleeding: None.  Movement: Present. Denies leaking of fluid.   The following portions of the patient's history were reviewed and updated as appropriate: allergies, current medications, past family history, past medical history, past social history, past surgical history and problem list.   Objective:   Vitals:   11/24/22 1444 11/24/22 1448  BP: 135/80 121/78  Pulse: 89 94  Weight: 215 lb 8 oz (97.8 kg)     Fetal Status: Fetal Heart Rate (bpm): 150   Movement: Present     General:  Alert, oriented and cooperative. Patient is in no acute distress.  Skin: Skin is warm and dry. No rash noted.   Cardiovascular: Normal heart rate noted  Respiratory: Normal respiratory effort, no problems with respiration noted  Abdomen: Soft, gravid, appropriate for gestational age.  Pain/Pressure: Absent     Pelvic: Cervical exam deferred        Extremities: Normal range of motion.  Edema: None  Mental Status: Normal mood and affect. Normal behavior. Normal judgment and thought content.   Assessment and Plan:  Pregnancy: G4P3003 at [redacted]w[redacted]d 1. Supervision of other normal pregnancy, antepartum - frequent fetal movement   2. [redacted] weeks gestation of pregnancy - tdap given  3. History of gestational diabetes - passed gtt this pregnancy  - discussed follow-up growth   Preterm labor symptoms and general obstetric precautions including but not limited to vaginal bleeding, contractions, leaking of fluid and fetal movement were reviewed in detail with the  patient. Please refer to After Visit Summary for other counseling recommendations.   No follow-ups on file.  Future Appointments  Date Time Provider Department Center  12/07/2022  1:30 PM [facility]3 [facility]  12/08/2022  1:30 PM Sue Lush, FNP [facility] None    Corlis Hove, NP

## 2022-11-24 NOTE — Progress Notes (Signed)
 Pt reports fetal movement, denies pain.

## 2022-12-07 ENCOUNTER — Ambulatory Visit: Payer: Medicaid Other | Attending: Maternal & Fetal Medicine

## 2022-12-07 DIAGNOSIS — O99213 Obesity complicating pregnancy, third trimester: Secondary | ICD-10-CM | POA: Insufficient documentation

## 2022-12-07 DIAGNOSIS — E669 Obesity, unspecified: Secondary | ICD-10-CM

## 2022-12-07 DIAGNOSIS — Z3A30 30 weeks gestation of pregnancy: Secondary | ICD-10-CM

## 2022-12-07 DIAGNOSIS — O09293 Supervision of pregnancy with other poor reproductive or obstetric history, third trimester: Secondary | ICD-10-CM

## 2022-12-08 ENCOUNTER — Ambulatory Visit (INDEPENDENT_AMBULATORY_CARE_PROVIDER_SITE_OTHER): Payer: Medicaid Other | Admitting: Obstetrics and Gynecology

## 2022-12-08 ENCOUNTER — Encounter: Payer: Self-pay | Admitting: Obstetrics and Gynecology

## 2022-12-08 VITALS — BP 110/61 | HR 92 | Wt 219.0 lb

## 2022-12-08 DIAGNOSIS — Z3A30 30 weeks gestation of pregnancy: Secondary | ICD-10-CM

## 2022-12-08 DIAGNOSIS — Z3480 Encounter for supervision of other normal pregnancy, unspecified trimester: Secondary | ICD-10-CM

## 2022-12-08 DIAGNOSIS — Z8632 Personal history of gestational diabetes: Secondary | ICD-10-CM

## 2022-12-08 NOTE — Progress Notes (Signed)
   PRENATAL VISIT NOTE  Subjective:  Debra Wyatt is a 37 y.o. G4P3003 at [redacted]w[redacted]d being seen today for ongoing prenatal care.  She is currently monitored for the following issues for this low-risk pregnancy and has History of gestational diabetes; Supervision of other normal pregnancy, antepartum; and Obesity affecting pregnancy in second trimester (pBMI 35.6) on their problem list.  Patient reports no complaints.  Contractions: Irritability. Vag. Bleeding: None.  Movement: Present. Denies leaking of fluid, vaginal bleeding, contractions  The following portions of the patient's history were reviewed and updated as appropriate: allergies, current medications, past family history, past medical history, past social history, past surgical history and problem list.   Objective:   Vitals:   12/08/22 1348  BP: 110/61  Pulse: 92  Weight: 219 lb (99.3 kg)    Fetal Status:     Movement: Present     General:  Alert, oriented and cooperative. Patient is in no acute distress.  Skin: Skin is warm and dry. No rash noted.   Cardiovascular: Normal heart rate noted  Respiratory: Normal respiratory effort, no problems with respiration noted  Abdomen: Soft, gravid, appropriate for gestational age.  Pain/Pressure: Absent     Pelvic: Cervical exam deferred        Extremities: Normal range of motion.  Edema: None  Mental Status: Normal mood and affect. Normal behavior. Normal judgment and thought content.   Assessment and Plan:  Pregnancy: G4P3003 at [redacted]w[redacted]d  1. Supervision of other normal pregnancy, antepartum BP and FHR normal  Feeling regular fetal movement FH appropriate   2. History of gestational diabetes Normal GTT 7/30 u/s EFW 48%, normal AFI, normal, follow up as clinically indicated  3. [redacted] weeks gestation of pregnancy Continue routine care, no concerns today  Preterm labor symptoms and general obstetric precautions including but not limited to vaginal bleeding, contractions, leaking  of fluid and fetal movement were reviewed in detail with the patient. Please refer to After Visit Summary for other counseling recommendations.   Return in two weeks for routine prenatal care  Albertine Grates, FNP

## 2022-12-21 ENCOUNTER — Encounter: Payer: Self-pay | Admitting: Obstetrics and Gynecology

## 2022-12-21 ENCOUNTER — Ambulatory Visit: Payer: Medicaid Other | Admitting: Obstetrics and Gynecology

## 2022-12-21 VITALS — BP 116/67 | HR 91 | Wt 219.7 lb

## 2022-12-21 DIAGNOSIS — O99212 Obesity complicating pregnancy, second trimester: Secondary | ICD-10-CM

## 2022-12-21 DIAGNOSIS — Z3009 Encounter for other general counseling and advice on contraception: Secondary | ICD-10-CM

## 2022-12-21 DIAGNOSIS — Z3A32 32 weeks gestation of pregnancy: Secondary | ICD-10-CM

## 2022-12-21 DIAGNOSIS — O99213 Obesity complicating pregnancy, third trimester: Secondary | ICD-10-CM

## 2022-12-21 DIAGNOSIS — Z3480 Encounter for supervision of other normal pregnancy, unspecified trimester: Secondary | ICD-10-CM

## 2022-12-21 DIAGNOSIS — Z8632 Personal history of gestational diabetes: Secondary | ICD-10-CM

## 2022-12-21 NOTE — Progress Notes (Signed)
   PRENATAL VISIT NOTE  Subjective:  Debra Wyatt is a 37 y.o. G4P3003 at [redacted]w[redacted]d being seen today for ongoing prenatal care.  She is currently monitored for the following issues for this high-risk pregnancy and has History of gestational diabetes; Supervision of other normal pregnancy, antepartum; Obesity affecting pregnancy in second trimester (pBMI 35.6); and Unwanted fertility on their problem list.  Patient reports no complaints.  Contractions: Irritability. Vag. Bleeding: None.  Movement: Present. Denies leaking of fluid.   The following portions of the patient's history were reviewed and updated as appropriate: allergies, current medications, past family history, past medical history, past social history, past surgical history and problem list.   Objective:   Vitals:   12/21/22 1448  BP: 116/67  Pulse: 91  Weight: 99.7 kg    Fetal Status: Fetal Heart Rate (bpm): 157   Movement: Present     General:  Alert, oriented and cooperative. Patient is in no acute distress.  Skin: Skin is warm and dry. No rash noted.   Cardiovascular: Normal heart rate noted  Respiratory: Normal respiratory effort, no problems with respiration noted  Abdomen: Soft, gravid, appropriate for gestational age.  Pain/Pressure: Absent     Pelvic: Cervical exam deferred        Extremities: Normal range of motion.  Edema: None  Mental Status: Normal mood and affect. Normal behavior. Normal judgment and thought content.   Assessment and Plan:  Pregnancy: G4P3003 at [redacted]w[redacted]d  1. Obesity affecting pregnancy in second trimester, unspecified obesity type  2. History of gestational diabetes Normal 2 hr GTT She thinks this baby is smaller than last pregnancy, which was 9'9"  3. Supervision of other normal pregnancy, antepartum  4. [redacted] weeks gestation of pregnancy   5. Unwanted fertility BTL papers signed 12/21/22   Preterm labor symptoms and general obstetric precautions including but not limited to  vaginal bleeding, contractions, leaking of fluid and fetal movement were reviewed in detail with the patient. Please refer to After Visit Summary for other counseling recommendations.   Return in about 2 weeks (around 01/04/2023) for high OB.  Future Appointments  Date Time Provider Department Center  01/06/2023  3:30 PM Sue Lush, FNP [facility] None  01/18/2023  2:50 PM Constant, Gigi Gin, MD [facility] None  01/25/2023  2:50 PM Constant, Gigi Gin, MD [facility] None    Conan Bowens, MD

## 2023-01-06 ENCOUNTER — Ambulatory Visit (INDEPENDENT_AMBULATORY_CARE_PROVIDER_SITE_OTHER): Payer: Medicaid Other | Admitting: Obstetrics and Gynecology

## 2023-01-06 VITALS — BP 125/79 | HR 106 | Wt 223.2 lb

## 2023-01-06 DIAGNOSIS — Z3A35 35 weeks gestation of pregnancy: Secondary | ICD-10-CM | POA: Diagnosis not present

## 2023-01-06 DIAGNOSIS — Z3009 Encounter for other general counseling and advice on contraception: Secondary | ICD-10-CM

## 2023-01-06 DIAGNOSIS — Z3483 Encounter for supervision of other normal pregnancy, third trimester: Secondary | ICD-10-CM

## 2023-01-06 DIAGNOSIS — Z3480 Encounter for supervision of other normal pregnancy, unspecified trimester: Secondary | ICD-10-CM

## 2023-01-06 NOTE — Progress Notes (Signed)
Pt presents for ROB visit. Pt c/o pelvic pain.

## 2023-01-06 NOTE — Progress Notes (Signed)
   PRENATAL VISIT NOTE  Subjective:  Debra Wyatt is a 37 y.o. G4P3003 at [redacted]w[redacted]d being seen today for ongoing prenatal care.  She is currently monitored for the following issues for this low-risk pregnancy and has History of gestational diabetes; Supervision of other normal pregnancy, antepartum; Obesity affecting pregnancy in second trimester (pBMI 35.6); and Unwanted fertility on their problem list.  Patient reports  pelvic pain and back bain .  Contractions: Irritability. Vag. Bleeding: None.  Movement: Present. Denies leaking of fluid.   The following portions of the patient's history were reviewed and updated as appropriate: allergies, current medications, past family history, past medical history, past social history, past surgical history and problem list.   Objective:   Vitals:   01/06/23 1559  BP: 125/79  Pulse: (!) 106  Weight: 223 lb 3.2 oz (101.2 kg)    Fetal Status:   Fundal Height: 35 cm Movement: Present     General:  Alert, oriented and cooperative. Patient is in no acute distress.  Skin: Skin is warm and dry. No rash noted.   Cardiovascular: Normal heart rate noted  Respiratory: Normal respiratory effort, no problems with respiration noted  Abdomen: Soft, gravid, appropriate for gestational age.  Pain/Pressure: Present     Pelvic: Cervical exam deferred        Extremities: Normal range of motion.  Edema: None  Mental Status: Normal mood and affect. Normal behavior. Normal judgment and thought content.   Assessment and Plan:  Pregnancy: G4P3003 at [redacted]w[redacted]d 1. Supervision of other normal pregnancy, antepartum BP and FHR normal Feeling regular fetal movement FH appropriate  2. Unwanted fertility BTL papers signed 8/13  3. [redacted] weeks gestation of pregnancy Anticipatory guidance regarding swabs next visit Symptomatic tx for pelvic/ back pain, discussed maternity belt  Preterm labor symptoms and general obstetric precautions including but not limited to vaginal  bleeding, contractions, leaking of fluid and fetal movement were reviewed in detail with the patient. Please refer to After Visit Summary for other counseling recommendations.   Return in one week for routine prenatal  Future Appointments  Date Time Provider Department Center  01/18/2023  2:50 PM Constant, Gigi Gin, MD [facility] None  01/25/2023  2:50 PM Constant, Gigi Gin, MD [facility] None    Albertine Grates, FNP

## 2023-01-18 ENCOUNTER — Ambulatory Visit (INDEPENDENT_AMBULATORY_CARE_PROVIDER_SITE_OTHER): Payer: Medicaid Other | Admitting: Obstetrics and Gynecology

## 2023-01-18 ENCOUNTER — Encounter: Payer: Self-pay | Admitting: Obstetrics and Gynecology

## 2023-01-18 ENCOUNTER — Other Ambulatory Visit (HOSPITAL_COMMUNITY)
Admission: RE | Admit: 2023-01-18 | Discharge: 2023-01-18 | Disposition: A | Discharge status: Home / Self Care | Payer: Medicaid Other | Source: Ambulatory Visit | Attending: Obstetrics and Gynecology | Admitting: Obstetrics and Gynecology

## 2023-01-18 VITALS — BP 121/78 | HR 106 | Wt 225.0 lb

## 2023-01-18 DIAGNOSIS — Z3009 Encounter for other general counseling and advice on contraception: Secondary | ICD-10-CM

## 2023-01-18 DIAGNOSIS — Z3480 Encounter for supervision of other normal pregnancy, unspecified trimester: Secondary | ICD-10-CM | POA: Insufficient documentation

## 2023-01-18 DIAGNOSIS — O99212 Obesity complicating pregnancy, second trimester: Secondary | ICD-10-CM

## 2023-01-18 NOTE — Progress Notes (Signed)
   PRENATAL VISIT NOTE  Subjective:  Debra Wyatt is a 37 y.o. G4P3003 at [redacted]w[redacted]d being seen today for ongoing prenatal care.  She is currently monitored for the following issues for this low-risk pregnancy and has History of gestational diabetes; Supervision of other normal pregnancy, antepartum; Obesity affecting pregnancy in second trimester (pBMI 35.6); and Unwanted fertility on their problem list.  Patient reports no complaints.  Contractions: Irregular. Vag. Bleeding: None.  Movement: Present. Denies leaking of fluid.   The following portions of the patient's history were reviewed and updated as appropriate: allergies, current medications, past family history, past medical history, past social history, past surgical history and problem list.   Objective:   Vitals:   01/18/23 1500  BP: 121/78  Pulse: (!) 106  Weight: 225 lb (102.1 kg)    Fetal Status: Fetal Heart Rate (bpm): 155 Fundal Height: 37 cm Movement: Present     General:  Alert, oriented and cooperative. Patient is in no acute distress.  Skin: Skin is warm and dry. No rash noted.   Cardiovascular: Normal heart rate noted  Respiratory: Normal respiratory effort, no problems with respiration noted  Abdomen: Soft, gravid, appropriate for gestational age.  Pain/Pressure: Present     Pelvic: Cervical exam performed in the presence of a chaperone Dilation: Fingertip Effacement (%): Thick Station: Ballotable  Extremities: Normal range of motion.     Mental Status: Normal mood and affect. Normal behavior. Normal judgment and thought content.   Assessment and Plan:  Pregnancy: G4P3003 at [redacted]w[redacted]d 1. Supervision of other normal pregnancy, antepartum Patient is doing well without complaints Cultures today  2. Obesity affecting pregnancy in second trimester, unspecified obesity type Continue ASA  3. Unwanted fertility BTL form previously signed  Preterm labor symptoms and general obstetric precautions including but not  limited to vaginal bleeding, contractions, leaking of fluid and fetal movement were reviewed in detail with the patient. Please refer to After Visit Summary for other counseling recommendations.   Return in about 1 week (around 01/25/2023) for in person, ROB, Low risk.  Future Appointments  Date Time Provider Department Center  01/25/2023  2:50 PM Terriyah Westra, Gigi Gin, MD [facility] None    Catalina Antigua, MD

## 2023-01-18 NOTE — Progress Notes (Signed)
Pt would like cervical exam. 

## 2023-01-19 LAB — CERVICOVAGINAL ANCILLARY ONLY
Chlamydia: NEGATIVE
Comment: NEGATIVE
Comment: NORMAL
Neisseria Gonorrhea: NEGATIVE

## 2023-01-22 LAB — CULTURE, BETA STREP (GROUP B ONLY): Strep Gp B Culture: NEGATIVE

## 2023-01-25 ENCOUNTER — Ambulatory Visit (INDEPENDENT_AMBULATORY_CARE_PROVIDER_SITE_OTHER): Payer: Medicaid Other | Admitting: Obstetrics and Gynecology

## 2023-01-25 ENCOUNTER — Encounter: Payer: Self-pay | Admitting: Obstetrics and Gynecology

## 2023-01-25 VITALS — BP 126/85 | HR 106 | Wt 225.9 lb

## 2023-01-25 DIAGNOSIS — O99212 Obesity complicating pregnancy, second trimester: Secondary | ICD-10-CM

## 2023-01-25 DIAGNOSIS — Z3009 Encounter for other general counseling and advice on contraception: Secondary | ICD-10-CM

## 2023-01-25 DIAGNOSIS — Z3480 Encounter for supervision of other normal pregnancy, unspecified trimester: Secondary | ICD-10-CM

## 2023-01-25 NOTE — Progress Notes (Signed)
   PRENATAL VISIT NOTE  Subjective:  Debra Wyatt is a 37 y.o. G4P3003 at [redacted]w[redacted]d being seen today for ongoing prenatal care.  She is currently monitored for the following issues for this low-risk pregnancy and has History of gestational diabetes; Supervision of other normal pregnancy, antepartum; Obesity affecting pregnancy in second trimester (pBMI 35.6); and Unwanted fertility on their problem list.  Patient reports no complaints.  Contractions: Irritability. Vag. Bleeding: None.  Movement: Present. Denies leaking of fluid.   The following portions of the patient's history were reviewed and updated as appropriate: allergies, current medications, past family history, past medical history, past social history, past surgical history and problem list.   Objective:   Vitals:   01/25/23 1459  BP: 126/85  Pulse: (!) 106  Weight: 225 lb 14.4 oz (102.5 kg)    Fetal Status: Fetal Heart Rate (bpm): 154 Fundal Height: 38 cm Movement: Present     General:  Alert, oriented and cooperative. Patient is in no acute distress.  Skin: Skin is warm and dry. No rash noted.   Cardiovascular: Normal heart rate noted  Respiratory: Normal respiratory effort, no problems with respiration noted  Abdomen: Soft, gravid, appropriate for gestational age.  Pain/Pressure: Present     Pelvic: Cervical exam deferred        Extremities: Normal range of motion.  Edema: Trace  Mental Status: Normal mood and affect. Normal behavior. Normal judgment and thought content.   Assessment and Plan:  Pregnancy: G4P3003 at [redacted]w[redacted]d 1. Supervision of other normal pregnancy, antepartum Patient is doing well without complaints Discussed IOL at 41 weeks if no labor  2. Unwanted fertility Plans BTL  3. Obesity affecting pregnancy in second trimester, unspecified obesity type Continue ASA  Preterm labor symptoms and general obstetric precautions including but not limited to vaginal bleeding, contractions, leaking of fluid  and fetal movement were reviewed in detail with the patient. Please refer to After Visit Summary for other counseling recommendations.   Return in about 1 week (around 02/01/2023) for in person, ROB, Low risk.  No future appointments.  Catalina Antigua, MD

## 2023-02-01 ENCOUNTER — Encounter: Payer: Self-pay | Admitting: Advanced Practice Midwife

## 2023-02-01 ENCOUNTER — Ambulatory Visit (INDEPENDENT_AMBULATORY_CARE_PROVIDER_SITE_OTHER): Payer: Medicaid Other | Admitting: Advanced Practice Midwife

## 2023-02-01 VITALS — BP 136/78 | HR 93 | Wt 226.2 lb

## 2023-02-01 DIAGNOSIS — Z3A38 38 weeks gestation of pregnancy: Secondary | ICD-10-CM

## 2023-02-01 DIAGNOSIS — Z3480 Encounter for supervision of other normal pregnancy, unspecified trimester: Secondary | ICD-10-CM

## 2023-02-01 DIAGNOSIS — Z8632 Personal history of gestational diabetes: Secondary | ICD-10-CM

## 2023-02-01 DIAGNOSIS — Z3009 Encounter for other general counseling and advice on contraception: Secondary | ICD-10-CM

## 2023-02-01 NOTE — Progress Notes (Signed)
   PRENATAL VISIT NOTE  Subjective:  Debra Wyatt is a 37 y.o. G4P3003 at [redacted]w[redacted]d being seen today for ongoing prenatal care.  She is currently monitored for the following issues for this low-risk pregnancy and has History of gestational diabetes; Supervision of other normal pregnancy, antepartum; Obesity affecting pregnancy in second trimester (pBMI 35.6); and Unwanted fertility on their problem list.  Patient reports occasional contractions.  Contractions: Irritability. Vag. Bleeding: None.  Movement: Present. Denies leaking of fluid.   The following portions of the patient's history were reviewed and updated as appropriate: allergies, current medications, past family history, past medical history, past social history, past surgical history and problem list.   Objective:   Vitals:   02/01/23 1359  BP: 136/78  Pulse: 93  Weight: 226 lb 3.2 oz (102.6 kg)    Fetal Status: Fetal Heart Rate (bpm): 158 Fundal Height: 39 cm Movement: Present     General:  Alert, oriented and cooperative. Patient is in no acute distress.  Skin: Skin is warm and dry. No rash noted.   Cardiovascular: Normal heart rate noted  Respiratory: Normal respiratory effort, no problems with respiration noted  Abdomen: Soft, gravid, appropriate for gestational age.  Pain/Pressure: Absent     Pelvic: Cervical exam performed in the presence of a chaperone Dilation: 1 Effacement (%): 0 Station: -3  Extremities: Normal range of motion.  Edema: None  Mental Status: Normal mood and affect. Normal behavior. Normal judgment and thought content.   Assessment and Plan:  Pregnancy: G4P3003 at [redacted]w[redacted]d 1. Supervision of other normal pregnancy, antepartum --Anticipatory guidance about next visits/weeks of pregnancy given.  --Reviewed labor readiness with patient including the [facility] [employer], evening primrose oil, and raspberry leaf tea.   --Postdates IOL scheduled for 41 weeks, orders placed  2. Unwanted fertility --BTL papers  signed  3. History of gestational diabetes --Normal GTT this pregnancy  4. [redacted] weeks gestation of pregnancy   Term labor symptoms and general obstetric precautions including but not limited to vaginal bleeding, contractions, leaking of fluid and fetal movement were reviewed in detail with the patient. Please refer to After Visit Summary for other counseling recommendations.   Return in about 1 week (around 02/08/2023) for LOB.  No future appointments.   Sharen Counter, CNM

## 2023-02-10 ENCOUNTER — Encounter: Payer: Medicaid Other | Admitting: Obstetrics and Gynecology

## 2023-02-10 ENCOUNTER — Encounter (HOSPITAL_COMMUNITY): Payer: Self-pay | Admitting: Family Medicine

## 2023-02-10 ENCOUNTER — Inpatient Hospital Stay (HOSPITAL_COMMUNITY)
Admission: AD | Admit: 2023-02-10 | Discharge: 2023-02-12 | DRG: 798 | Disposition: A | Discharge status: Home / Self Care | Payer: Medicaid Other | Attending: Family Medicine | Admitting: Family Medicine

## 2023-02-10 DIAGNOSIS — Z825 Family history of asthma and other chronic lower respiratory diseases: Secondary | ICD-10-CM

## 2023-02-10 DIAGNOSIS — O480 Post-term pregnancy: Secondary | ICD-10-CM | POA: Diagnosis present

## 2023-02-10 DIAGNOSIS — Z302 Encounter for sterilization: Secondary | ICD-10-CM

## 2023-02-10 DIAGNOSIS — Z8249 Family history of ischemic heart disease and other diseases of the circulatory system: Secondary | ICD-10-CM

## 2023-02-10 DIAGNOSIS — O9902 Anemia complicating childbirth: Secondary | ICD-10-CM | POA: Diagnosis present

## 2023-02-10 DIAGNOSIS — Z7982 Long term (current) use of aspirin: Secondary | ICD-10-CM

## 2023-02-10 DIAGNOSIS — Z833 Family history of diabetes mellitus: Secondary | ICD-10-CM

## 2023-02-10 DIAGNOSIS — Z3480 Encounter for supervision of other normal pregnancy, unspecified trimester: Secondary | ICD-10-CM

## 2023-02-10 DIAGNOSIS — Z8632 Personal history of gestational diabetes: Secondary | ICD-10-CM | POA: Diagnosis not present

## 2023-02-10 DIAGNOSIS — O4202 Full-term premature rupture of membranes, onset of labor within 24 hours of rupture: Secondary | ICD-10-CM | POA: Diagnosis not present

## 2023-02-10 DIAGNOSIS — O99214 Obesity complicating childbirth: Secondary | ICD-10-CM | POA: Diagnosis present

## 2023-02-10 DIAGNOSIS — Z3A40 40 weeks gestation of pregnancy: Secondary | ICD-10-CM

## 2023-02-10 DIAGNOSIS — O26893 Other specified pregnancy related conditions, third trimester: Secondary | ICD-10-CM | POA: Diagnosis present

## 2023-02-10 LAB — CBC
HCT: 37 % (ref 36.0–46.0)
Hemoglobin: 12 g/dL (ref 12.0–15.0)
MCH: 25.9 pg — ABNORMAL LOW (ref 26.0–34.0)
MCHC: 32.4 g/dL (ref 30.0–36.0)
MCV: 79.7 fL — ABNORMAL LOW (ref 80.0–100.0)
Platelets: 208 10*3/uL (ref 150–400)
RBC: 4.64 MIL/uL (ref 3.87–5.11)
RDW: 13.7 % (ref 11.5–15.5)
WBC: 9.2 10*3/uL (ref 4.0–10.5)
nRBC: 0 % (ref 0.0–0.2)

## 2023-02-10 LAB — TYPE AND SCREEN
ABO/RH(D): O POS
Antibody Screen: NEGATIVE

## 2023-02-10 LAB — RPR: RPR Ser Ql: NONREACTIVE

## 2023-02-10 MED ORDER — SENNOSIDES-DOCUSATE SODIUM 8.6-50 MG PO TABS
2.0000 | ORAL_TABLET | ORAL | Status: DC
  Administered 2023-02-10 – 2023-02-12 (×3): 2 via ORAL
  Filled 2023-02-10 (×3): qty 2

## 2023-02-10 MED ORDER — ONDANSETRON HCL 4 MG/2ML IJ SOLN: 4.0000 mg | INTRAMUSCULAR | Status: DC | PRN

## 2023-02-10 MED ORDER — ONDANSETRON HCL 4 MG PO TABS: 4.0000 mg | ORAL_TABLET | ORAL | Status: DC | PRN

## 2023-02-10 MED ORDER — MISOPROSTOL 25 MCG QUARTER TABLET: 25.0000 ug | ORAL_TABLET | Freq: Once | ORAL | Status: DC

## 2023-02-10 MED ORDER — LACTATED RINGERS IV SOLN: 500.0000 mL | INTRAVENOUS | Status: DC | PRN

## 2023-02-10 MED ORDER — TERBUTALINE SULFATE 1 MG/ML IJ SOLN: 0.2500 mg | Freq: Once | INTRAMUSCULAR | Status: DC | PRN

## 2023-02-10 MED ORDER — IBUPROFEN 600 MG PO TABS
600.0000 mg | ORAL_TABLET | Freq: Four times a day (QID) | ORAL | Status: DC
  Administered 2023-02-10 – 2023-02-12 (×8): 600 mg via ORAL
  Filled 2023-02-10 (×8): qty 1

## 2023-02-10 MED ORDER — DIBUCAINE (PERIANAL) 1 % EX OINT: 1.0000 | TOPICAL_OINTMENT | CUTANEOUS | Status: DC | PRN

## 2023-02-10 MED ORDER — OXYCODONE-ACETAMINOPHEN 5-325 MG PO TABS: 2.0000 | ORAL_TABLET | ORAL | Status: DC | PRN

## 2023-02-10 MED ORDER — SODIUM CHLORIDE 0.9% FLUSH: 3.0000 mL | INTRAVENOUS | Status: DC | PRN

## 2023-02-10 MED ORDER — LACTATED RINGERS IV SOLN: INTRAVENOUS | Status: DC

## 2023-02-10 MED ORDER — SODIUM CHLORIDE 0.9 % IV SOLN: 250.0000 mL | INTRAVENOUS | Status: DC | PRN

## 2023-02-10 MED ORDER — COCONUT OIL OIL: 1.0000 | TOPICAL_OIL | Status: DC | PRN

## 2023-02-10 MED ORDER — SOD CITRATE-CITRIC ACID 500-334 MG/5ML PO SOLN: 30.0000 mL | ORAL | Status: DC | PRN

## 2023-02-10 MED ORDER — OXYCODONE HCL 5 MG PO TABS
5.0000 mg | ORAL_TABLET | Freq: Four times a day (QID) | ORAL | Status: DC | PRN
  Administered 2023-02-10 – 2023-02-12 (×5): 5 mg via ORAL
  Filled 2023-02-10 (×5): qty 1

## 2023-02-10 MED ORDER — WITCH HAZEL-GLYCERIN EX PADS: 1.0000 | MEDICATED_PAD | CUTANEOUS | Status: DC | PRN

## 2023-02-10 MED ORDER — ACETAMINOPHEN 325 MG PO TABS: 650.0000 mg | ORAL_TABLET | ORAL | Status: DC | PRN

## 2023-02-10 MED ORDER — MISOPROSTOL 50MCG HALF TABLET: 50.0000 ug | ORAL_TABLET | Freq: Once | ORAL | Status: DC

## 2023-02-10 MED ORDER — OXYTOCIN-SODIUM CHLORIDE 30-0.9 UT/500ML-% IV SOLN
2.5000 [IU]/h | INTRAVENOUS | Status: DC
  Administered 2023-02-10: 2.5 [IU]/h via INTRAVENOUS
  Filled 2023-02-10: qty 500

## 2023-02-10 MED ORDER — ONDANSETRON HCL 4 MG/2ML IJ SOLN: 4.0000 mg | Freq: Four times a day (QID) | INTRAMUSCULAR | Status: DC | PRN

## 2023-02-10 MED ORDER — OXYTOCIN BOLUS FROM INFUSION
333.0000 mL | Freq: Once | INTRAVENOUS | Status: AC
  Administered 2023-02-10: 333 mL via INTRAVENOUS

## 2023-02-10 MED ORDER — SIMETHICONE 80 MG PO CHEW: 80.0000 mg | CHEWABLE_TABLET | ORAL | Status: DC | PRN

## 2023-02-10 MED ORDER — OXYCODONE-ACETAMINOPHEN 5-325 MG PO TABS: 1.0000 | ORAL_TABLET | ORAL | Status: DC | PRN

## 2023-02-10 MED ORDER — BENZOCAINE-MENTHOL 20-0.5 % EX AERO
1.0000 | INHALATION_SPRAY | CUTANEOUS | Status: DC | PRN
  Administered 2023-02-10 – 2023-02-12 (×2): 1 via TOPICAL
  Filled 2023-02-10 (×2): qty 56

## 2023-02-10 MED ORDER — LIDOCAINE HCL (PF) 1 % IJ SOLN: 30.0000 mL | INTRAMUSCULAR | Status: DC | PRN

## 2023-02-10 MED ORDER — ACETAMINOPHEN 325 MG PO TABS
650.0000 mg | ORAL_TABLET | ORAL | Status: DC | PRN
  Administered 2023-02-10 – 2023-02-12 (×3): 650 mg via ORAL
  Filled 2023-02-10 (×3): qty 2

## 2023-02-10 MED ORDER — ZOLPIDEM TARTRATE 5 MG PO TABS: 5.0000 mg | ORAL_TABLET | Freq: Every evening | ORAL | Status: DC | PRN

## 2023-02-10 MED ORDER — DIPHENHYDRAMINE HCL 25 MG PO CAPS: 25.0000 mg | ORAL_CAPSULE | Freq: Four times a day (QID) | ORAL | Status: DC | PRN

## 2023-02-10 MED ORDER — SODIUM CHLORIDE 0.9% FLUSH
3.0000 mL | Freq: Two times a day (BID) | INTRAVENOUS | Status: DC
  Administered 2023-02-11: 3 mL via INTRAVENOUS

## 2023-02-10 MED ORDER — PRENATAL MULTIVITAMIN CH
1.0000 | ORAL_TABLET | Freq: Every day | ORAL | Status: DC
  Administered 2023-02-10 – 2023-02-12 (×3): 1 via ORAL
  Filled 2023-02-10 (×3): qty 1

## 2023-02-10 NOTE — Discharge Summary (Signed)
Postpartum Discharge Summary      Patient Name: Debra Wyatt DOB: 08-29-1985 MRN: [id number]  Date of admission: 02/10/2023 Delivery date:02/10/2023 Delivering provider: CHUBB, CASEY C Date of discharge: 02/12/2023  Admitting diagnosis: Post term pregnancy at [redacted] weeks gestation [O48.0, Z3A.41] Intrauterine pregnancy: [redacted]w[redacted]d     Secondary diagnosis:  Principal Problem:   Post term pregnancy at [redacted] weeks gestation  Additional problems: None   Discharge diagnosis: Term Pregnancy Delivered                                              Post partum procedures:postpartum tubal ligation Augmentation: N/A Complications: 69min17sec Shoulder Dystocia requiring delivery of posterior arm c/b fractured humerus   Hospital course: Onset of Labor With Vaginal Delivery      37 y.o. yo [id number] at [redacted]w[redacted]d was admitted in Active Labor on 02/10/2023. Labor course was complicated by shoulder dystocia as above.  Membrane Rupture Time/Date: 7:15 AM,02/10/2023  Delivery Method:Vaginal, Spontaneous Operative Delivery:N/A Episiotomy: None Lacerations:  None Patient had an uncomplicated postpartum course.  She is ambulating, tolerating a regular diet, passing flatus, and urinating well. Patient is discharged home in stable condition on 02/12/23.  Newborn Data: Birth date:02/10/2023 Birth time:9:26 AM Gender:Female Living status:Living Apgars:7 ,9  Weight:4270 g  Magnesium Sulfate received: No BMZ received: No Rhophylac:N/A MMR:N/A T-DaP:Given prenatally Flu: No RSV Vaccine received: No Transfusion:No  Immunizations received: Immunization History  Administered Date(s) Administered   Influenza,inj,Quad PF,6+ Mos 01/25/2016   MMR 01/25/2016   Tdap 01/24/2016, 12/30/2020    Physical exam  Vitals:   02/11/23 1342 02/11/23 1515 02/11/23 2026 02/12/23 0316  BP: 118/70 112/60 123/65 114/69  Pulse: 71 95 84 66  Resp: (!) 22 18 17 16   Temp: 97.6 F (36.4 C) 98.1 F (36.7 C) 98.4 F (36.9 C)  98.6 F (37 C)  TempSrc: Oral Oral Oral Oral  SpO2: 99% 100% 100% 99%  Weight:      Height:       General: alert, cooperative, and no distress Lochia: appropriate Uterine Fundus: firm Incision: Healing well with no significant drainage, No significant erythema, Dressing is clean, dry, and intact DVT Evaluation: No evidence of DVT seen on physical exam. No cords or calf tenderness. No significant calf/ankle edema. Labs: Lab Results  Component Value Date   WBC 9.2 02/11/2023   HGB 9.9 (L) 02/11/2023   HCT 31.2 (L) 02/11/2023   MCV 80.6 02/11/2023   PLT 190 02/11/2023       No data to display         Edinburgh Score:    02/10/2023    6:36 PM  Edinburgh Postnatal Depression Scale Screening Tool  I have been able to laugh and see the funny side of things. 0  I have looked forward with enjoyment to things. 0  I have blamed myself unnecessarily when things went wrong. 0  I have been anxious or worried for no good reason. 1  I have felt scared or panicky for no good reason. 1  Things have been getting on top of me. 1  I have been so unhappy that I have had difficulty sleeping. 0  I have felt sad or miserable. 0  I have been so unhappy that I have been crying. 0  The thought of harming myself has occurred to me. 0  Edinburgh Postnatal Depression Scale Total 3  No data recorded  After visit meds:  Allergies as of 02/12/2023   No Known Allergies      Medication List     STOP taking these medications    aspirin EC 81 MG tablet Commonly known as: Aspirin 81       TAKE these medications    ibuprofen 600 MG tablet Commonly known as: ADVIL Take 1 tablet (600 mg total) by mouth every 6 (six) hours.   oxyCODONE 5 MG immediate release tablet Commonly known as: Oxy IR/ROXICODONE Take 1 tablet (5 mg total) by mouth every 6 (six) hours as needed for moderate pain.   senna-docusate 8.6-50 MG tablet Commonly known as: Senokot-S Take 2 tablets by mouth daily.    Vitafol Ultra 29-0.6-0.4-200 MG Caps Take 1 capsule by mouth daily before breakfast.         Discharge home in stable condition Infant Feeding: Formula Infant Disposition:home with mother Discharge instruction: per After Visit Summary and Postpartum booklet. Activity: Advance as tolerated. Pelvic rest for 6 weeks.  Diet: routine diet Future Appointments: Future Appointments  Date Time Provider Department Center  03/16/2023 10:55 AM Warden Fillers, MD [facility] None   Follow up Visit: Sent message to [facility] 10/3  Please schedule this patient for a In person postpartum visit in 4 weeks with the following provider: Any provider. Additional Postpartum F/U: n/a   Low risk pregnancy complicated by:  Shoulder dystocia  Delivery mode:  Vaginal, Spontaneous Anticipated Birth Control:  s/p PP BTL   02/12/2023 Sundra Aland, MD

## 2023-02-10 NOTE — Progress Notes (Signed)
Pt Sitting up EFM tracing maternal Heartrate

## 2023-02-10 NOTE — MAU Note (Signed)
.  Debra Wyatt is a 37 y.o. at [redacted]w[redacted]d here in MAU reporting: Gush of fluids around 0715. Clear fluids and no odors. Reports CTX's began around 50. Reports fetal movement but reports DFM for the past two days. OB aware. Denies VB.   GBS-. Last cervical exam on 9/24 was 1 cm. Reports in previous deliveries "they usually get stuck." Patient believes she has had shoulder dystocia's with all three previous deliveries.  Onset of complaint: 0715 Pain score: 8/10 lower abdomen  FHT: 157 initial external Lab orders placed from triage:  MAU Labor Eval

## 2023-02-10 NOTE — Progress Notes (Signed)
Called to the room urgently at 0924 via Vocera due to shoulder dystocia. When I entered the room fetal head was at the perineum with purplish hue and turtle sign. Dr Judd Lien had attempted rotational maneuvers unsuccessfully- he verbally told me that woodscrew had been attempted.   Total time at the point was ~45 seconds. I attempted Woodscrew, Reverse Woodscrew and RN was applying suprapubic pressure but these did not release the shoulder. Internal exam showed that posterior arm (left) was wrapped behind back with flexed elbow and hand tucked on right side body. I dislodged the fetal hand and mobilized the elbow to to assist delivery of the the posterior arm. This lead to delivery of the left arm and then resolution of the right impacted shoulder dystocia. I was able to deliver the infant though I needed to pull the infant's abdomen through the introitus with some maternal pushing. Cord was clamped and cut due to poor tone and color and infant was transferred immediately to team at the fetal warmer. NICU arrived shortly. I was called to another emergency but returned in about 10 minutes to check on family and infant.  Assessment at the warmer showed left humerus deformity. I discussed the broken left humerus with the mother, father and MGM who were at bedside. I reviewed the need for urgent delivery due to shoulder dystocia. I answered their questions about infant pain, next steps for treatment.  Total time of shoulder was 62m17sec  Apgars: 7 and 9  Birthweight: 9#8oz  Federico Flake, MD, MPH, ABFM, [facility] Attending Physician Center for [facility]

## 2023-02-10 NOTE — H&P (Signed)
OBSTETRIC ADMISSION HISTORY AND PHYSICAL  Debra Wyatt is a 37 y.o. female [phone] with IUP at [redacted]w[redacted]d by first trimester ultrasound presenting for evaluation of gush of fluids around 0715 AM this morning. She reports +FMs, no VB, no blurry vision, headaches or peripheral edema, and RUQ pain.  She plans on breast and bottle feeding. She request BTL for birth control. She received her prenatal care at  [facility]    Dating: By first trimester ultrasound --->  Estimated Date of Delivery: 02/10/23  Sono:    @[redacted]w[redacted]d , CWD, normal anatomy, variable presentation, Posterior placenta, 1683g, 48% EFW   Prenatal History/Complications:  Patient Active Problem List   Diagnosis Date Noted   Post term pregnancy at [redacted] weeks gestation 02/10/2023   Unwanted fertility 12/21/2022   Obesity affecting pregnancy in second trimester (pBMI 35.6) 11/03/2022   Supervision of other normal pregnancy, antepartum 07/13/2022   History of gestational diabetes 05/07/2021   Nursing Staff Provider  Office Location [facility] Dating  02/10/2023, Date entered prior to episode creation  [facility] Model Arly.Keller ] Traditional [ ]  Centering [ ]  Mom-Baby Dyad      Language  English Anatomy US     Flu Vaccine  Declined 3/5 Genetic/Carrier Screen  NIPS:   LR female AFP:   screen neg Horizon: Neg in 2022  TDaP Vaccine   11/24/2022 Hgb A1C or  GTT Early  Third trimester 2 hour wnl  COVID Vaccine Not vaccinated   LAB RESULTS   Rhogam  O/Positive/-- (03/05 1412)  Blood Type O/Positive/-- (03/05 1412)   Baby Feeding Plan Breast/Bottle Antibody Negative (03/05 1412)  Contraception BTL? Rubella 1.19 (03/05 1412)  Circumcision N/a RPR Non Reactive (07/02 0952)   Pediatrician  [facility] HBsAg Negative (03/05 1412)   Support Person FOB- Christopher and Mom HCVAb Non Reactive (03/05 1412)   Prenatal Classes   HIV Non Reactive (07/02 0952)     BTL Consent   GBS  negative (For PCN allergy, check sensitivities)   VBAC Consent   Pap        Diagnosis  Date Value Ref Range Status  08/17/2022     Final    - Negative for intraepithelial lesion or malignancy (NILM)             DME Rx Arly.Keller ] BP cuff [ X] Weight Scale Waterbirth  [ ]  Class [ ]  Consent [ ]  CNM visit  PHQ9 & GAD7 [ X ] new OB [ x ] 28 weeks  [  ] 36 weeks Induction  [ ]  Orders Entered [ ] Foley Y/N    Past Medical History: Past Medical History:  Diagnosis Date   Eczema    Gestational diabetes    Headache    Medical history non-contributory    Ovarian cyst    UTI (urinary tract infection)    Visit for routine gyn exam 01/07/2022    Past Surgical History: Past Surgical History:  Procedure Laterality Date   TONSILLECTOMY     WISDOM TOOTH EXTRACTION      Obstetrical History: OB History     Gravida  4   Para  4   Term  4   Preterm      AB      Living  4      SAB      IAB      Ectopic      Multiple  0   Live Births  4  Social History Social History   Socioeconomic History   Marital status: Single    Spouse name: Not on file   Number of children: 2   Years of education: Not on file   Highest education level: Not on file  Occupational History   Not on file  Tobacco Use   Smoking status: Never   Smokeless tobacco: Never  Vaping Use   Vaping status: Never Used  Substance and Sexual Activity   Alcohol use: Not Currently    Comment: occ, prior to pregnancy   Drug use: No   Sexual activity: Yes    Partners: Male    Birth control/protection: None  Other Topics Concern   Not on file  Social History Narrative   Not on file   Social Determinants of Health   Financial Resource Strain: Not on file  Food Insecurity: No Food Insecurity (02/04/2021)   Hunger Vital Sign    Worried About Running Out of Food in the Last Year: Never true    Ran Out of Food in the Last Year: Never true  Transportation Needs: No Transportation Needs (02/11/2021)   PRAPARE - Administrator, Civil Service (Medical): No     Lack of Transportation (Non-Medical): No  Physical Activity: Not on file  Stress: Not on file  Social Connections: Not on file    Family History: Family History  Problem Relation Age of Onset   Healthy Mother    Hypertension Father    Asthma Brother    Cancer Maternal Grandfather    Diabetes Paternal Grandmother    Diabetes Paternal Grandfather     Allergies: No Known Allergies  Medications Prior to Admission  Medication Sig Dispense Refill Last Dose   aspirin EC (ASPIRIN 81) 81 MG tablet Take 1 tablet (81 mg total) by mouth daily. Swallow whole. 30 tablet 12    Prenat-Fe Poly-Methfol-FA-DHA (VITAFOL ULTRA) 29-0.6-0.4-200 MG CAPS Take 1 capsule by mouth daily before breakfast. 90 capsule 3      Review of Systems   All systems reviewed and negative except as stated in HPI  Blood pressure 130/72, pulse 78, temperature 97.8 F (36.6 C), temperature source Axillary, resp. rate 18, height 5\' 3"  (1.6 m), weight 103.1 kg, last menstrual period 05/06/2022, SpO2 99%, unknown if currently breastfeeding. General appearance: alert, cooperative, and appears stated age, painfully contraction about every 2 mins  Lungs: clear to auscultation bilaterally Heart: regular rate and rhythm Abdomen: soft, non-tender; bowel sounds normal Pelvic: normal female genitalia  Extremities: Homans sign is negative, no sign of DVT Presentation: cephalic Fetal monitoringBaseline: 145 bpm, Variability: Good {> 6 bpm), Accelerations: none, and Decelerations: Variable: mild Uterine activityFrequency: Every 1-2 minutes and Duration: 45-60 seconds Dilation: 10 Effacement (%): 100 Station: Plus 2 Exam by:: Dr. Judd Lien   Prenatal labs: ABO, Rh: --/--/O POS (10/03 0981) Antibody: NEG (10/03 0903) Rubella: 1.19 (03/05 1412) RPR: NON REACTIVE (10/03 0903)  HBsAg: Negative (03/05 1412)  HIV: Non Reactive (07/02 0952)  GBS: Negative/-- (09/10 1533)  2h Glucola Normal Genetic screening  Negative Anatomy US  Normal  Prenatal Transfer Tool  Maternal Diabetes: No Genetic Screening: Normal Maternal Ultrasounds/Referrals: Normal Fetal Ultrasounds or other Referrals:  Other: Anatomy US normal  Maternal Substance Abuse:  No Significant Maternal Medications:  Meds include: Other: aspirin 81mg  daily  Significant Maternal Lab Results:  None Number of Prenatal Visits:greater than 3 verified prenatal visits   Results for orders placed or performed during the hospital encounter of 02/10/23 (from  the past 24 hour(s))  CBC   Collection Time: 02/10/23  9:03 AM  Result Value Ref Range   WBC 9.2 4.0 - 10.5 K/uL   RBC 4.64 3.87 - 5.11 MIL/uL   Hemoglobin 12.0 12.0 - 15.0 g/dL   HCT 78.2 95.6 - 21.3 %   MCV 79.7 (L) 80.0 - 100.0 fL   MCH 25.9 (L) 26.0 - 34.0 pg   MCHC 32.4 30.0 - 36.0 g/dL   RDW 08.6 57.8 - 46.9 %   Platelets 208 150 - 400 K/uL   nRBC 0.0 0.0 - 0.2 %  RPR   Collection Time: 02/10/23  9:03 AM  Result Value Ref Range   RPR Ser Ql NON REACTIVE NON REACTIVE  Type and screen   Collection Time: 02/10/23  9:03 AM  Result Value Ref Range   ABO/RH(D) O POS    Antibody Screen NEG    Sample Expiration      02/13/2023,2359 Performed at [facility], [address] [place], [place] [address]     Patient Active Problem List   Diagnosis Date Noted   Post term pregnancy at [redacted] weeks gestation 02/10/2023   Unwanted fertility 12/21/2022   Obesity affecting pregnancy in second trimester (pBMI 35.6) 11/03/2022   Supervision of other normal pregnancy, antepartum 07/13/2022   History of gestational diabetes 05/07/2021    Assessment/Plan:  Debra Wyatt is a 37 y.o. G4P4004 at [redacted]w[redacted]d here for SROM.   #Labor:Anticipate cervical dilation to spontaneous vaginal delivery  #Pain: Tolerated without analgesia  #FWB: Cat 2 #ID:  GBS negative, HIV, RPR GC negative as well  #MOF: Breast/Bottle #MOC: BTL  #Obesity #Hx of shoulder dystocia: #GDM hx: normal 2hr GTT, EFW 48% at 30w,  shoulder dystocia with 9lb9oz prior pregnancy, she feels as if this baby is smaller -shoulder precautions -pp hemorrhage precautions   #Desires sterility: consented for PP BTL 8/13   Charma Igo, MD  02/10/2023, 2:14 PM  Evaluation and management procedures were performed by the [facility] Medicine Resident under my supervision. I was immediately available for direct supervision, assistance and direction throughout this encounter.  I also confirm that I have verified the information documented in the resident's note, and that I have also personally reperformed the pertinent components of the physical exam and all of the medical decision making activities.  I have also made any necessary editorial changes.   Mittie Bodo, MD Family Medicine - Obstetrics Fellow

## 2023-02-10 NOTE — Lactation Note (Signed)
This note was copied from a baby's chart. Lactation Consultation Note  Patient Name: Debra Wyatt [id number] Date: 02/10/2023 Age:35 hours Reason for consult: Initial assessment;Term Experienced BF mom stated that she is going to pump and bottle feed for now d/t the arm fx: of baby. Mom's feeding choice is Breast milk/formula. Mom is using hand pump. LC offered to set up DEBP mom declined. Encouraged if she changes her mind to let us know.  Discussed milk storage, STS, pumping, I&O and feeding amount needed. Mom doesn't have any questions at this time. Encouraged mom if she needs assistance or has questions to call. Mom thinks she is good for now. Will call if she changes her mind. Putting mom PRN for LC.    Maternal Data Does the patient have breastfeeding experience prior to this delivery?: Yes How long did the patient breastfeed?: 1st child now 14 BF 5 months, 2nd child now 7 BF for 7 months, 3rd child now 2 BF for 1 yr  Feeding    LATCH Score                    Lactation Tools Discussed/Used Tools: Pump Breast pump type: Manual (mom declined DEBP) Pump Education: Milk Storage Reason for Pumping: mom's choice  Interventions Interventions: Hand pump;LC Services brochure  Discharge    Consult Status Consult Status: PRN    Charyl Dancer 02/10/2023, 9:28 PM

## 2023-02-11 ENCOUNTER — Inpatient Hospital Stay (HOSPITAL_COMMUNITY): Payer: Medicaid Other | Admitting: Anesthesiology

## 2023-02-11 ENCOUNTER — Encounter (HOSPITAL_COMMUNITY)
Admission: AD | Disposition: A | Discharge status: Home / Self Care | Payer: Self-pay | Source: Home / Self Care | Attending: Family Medicine

## 2023-02-11 ENCOUNTER — Encounter (HOSPITAL_COMMUNITY): Payer: Self-pay | Admitting: Family Medicine

## 2023-02-11 DIAGNOSIS — Z302 Encounter for sterilization: Secondary | ICD-10-CM

## 2023-02-11 HISTORY — PX: TUBAL LIGATION: SHX77

## 2023-02-11 LAB — CBC
HCT: 31.2 % — ABNORMAL LOW (ref 36.0–46.0)
Hemoglobin: 9.9 g/dL — ABNORMAL LOW (ref 12.0–15.0)
MCH: 25.6 pg — ABNORMAL LOW (ref 26.0–34.0)
MCHC: 31.7 g/dL (ref 30.0–36.0)
MCV: 80.6 fL (ref 80.0–100.0)
Platelets: 190 10*3/uL (ref 150–400)
RBC: 3.87 MIL/uL (ref 3.87–5.11)
RDW: 13.7 % (ref 11.5–15.5)
WBC: 9.2 10*3/uL (ref 4.0–10.5)
nRBC: 0 % (ref 0.0–0.2)

## 2023-02-11 LAB — GLUCOSE, CAPILLARY: Glucose-Capillary: 99 mg/dL (ref 70–99)

## 2023-02-11 SURGERY — LIGATION, FALLOPIAN TUBE, POSTPARTUM
Anesthesia: Choice

## 2023-02-11 MED ORDER — EPHEDRINE SULFATE-NACL 50-0.9 MG/10ML-% IV SOSY
PREFILLED_SYRINGE | INTRAVENOUS | Status: DC | PRN
  Administered 2023-02-11 (×3): 5 mg via INTRAVENOUS

## 2023-02-11 MED ORDER — PHENYLEPHRINE HCL-NACL 20-0.9 MG/250ML-% IV SOLN
INTRAVENOUS | Status: DC | PRN
Start: 2023-02-11 — End: 2023-02-11
  Administered 2023-02-11: 60 ug/min via INTRAVENOUS

## 2023-02-11 MED ORDER — FENTANYL CITRATE (PF) 100 MCG/2ML IJ SOLN: 25.0000 ug | INTRAMUSCULAR | Status: DC | PRN

## 2023-02-11 MED ORDER — KETOROLAC TROMETHAMINE 30 MG/ML IJ SOLN: 30.0000 mg | Freq: Once | INTRAMUSCULAR | Status: DC | PRN

## 2023-02-11 MED ORDER — LACTATED RINGERS IV SOLN: INTRAVENOUS | Status: DC | PRN

## 2023-02-11 MED ORDER — EPHEDRINE 5 MG/ML INJ
INTRAVENOUS | Status: AC
  Filled 2023-02-11: qty 5

## 2023-02-11 MED ORDER — BUPIVACAINE IN DEXTROSE 0.75-8.25 % IT SOLN
INTRATHECAL | Status: DC | PRN
  Administered 2023-02-11: 1.5 mg via INTRATHECAL

## 2023-02-11 MED ORDER — MIDAZOLAM HCL 2 MG/2ML IJ SOLN
INTRAMUSCULAR | Status: AC
  Filled 2023-02-11: qty 2

## 2023-02-11 MED ORDER — OXYCODONE HCL 5 MG/5ML PO SOLN: 5.0000 mg | Freq: Once | ORAL | Status: DC | PRN

## 2023-02-11 MED ORDER — BUPIVACAINE HCL (PF) 0.25 % IJ SOLN
INTRAMUSCULAR | Status: AC
  Filled 2023-02-11: qty 30

## 2023-02-11 MED ORDER — OXYCODONE HCL 5 MG PO TABS: 5.0000 mg | ORAL_TABLET | Freq: Once | ORAL | Status: DC | PRN

## 2023-02-11 MED ORDER — METOCLOPRAMIDE HCL 10 MG PO TABS
10.0000 mg | ORAL_TABLET | Freq: Once | ORAL | Status: AC
  Administered 2023-02-11: 10 mg via ORAL
  Filled 2023-02-11: qty 1

## 2023-02-11 MED ORDER — FENTANYL CITRATE (PF) 100 MCG/2ML IJ SOLN
INTRAMUSCULAR | Status: AC
  Filled 2023-02-11: qty 2

## 2023-02-11 MED ORDER — BUPIVACAINE HCL (PF) 0.25 % IJ SOLN
INTRAMUSCULAR | Status: DC | PRN
  Administered 2023-02-11: 30 mL

## 2023-02-11 MED ORDER — LACTATED RINGERS IV SOLN: INTRAVENOUS | Status: DC

## 2023-02-11 MED ORDER — FENTANYL CITRATE (PF) 100 MCG/2ML IJ SOLN
INTRAMUSCULAR | Status: DC | PRN
  Administered 2023-02-11: 50 ug via INTRAVENOUS
  Administered 2023-02-11: 35 ug via INTRAVENOUS

## 2023-02-11 MED ORDER — SODIUM CHLORIDE 0.9 % IR SOLN: Status: DC | PRN

## 2023-02-11 MED ORDER — ONDANSETRON HCL 4 MG/2ML IJ SOLN
INTRAMUSCULAR | Status: DC | PRN
  Administered 2023-02-11: 4 mg via INTRAVENOUS

## 2023-02-11 MED ORDER — MIDAZOLAM HCL 5 MG/5ML IJ SOLN
INTRAMUSCULAR | Status: DC | PRN
  Administered 2023-02-11: 2 mg via INTRAVENOUS

## 2023-02-11 MED ORDER — PHENYLEPHRINE HCL-NACL 20-0.9 MG/250ML-% IV SOLN
INTRAVENOUS | Status: AC
  Filled 2023-02-11: qty 250

## 2023-02-11 MED ORDER — DROPERIDOL 2.5 MG/ML IJ SOLN: 0.6250 mg | Freq: Once | INTRAMUSCULAR | Status: DC | PRN

## 2023-02-11 MED ORDER — FENTANYL CITRATE (PF) 100 MCG/2ML IJ SOLN
INTRAMUSCULAR | Status: DC | PRN
Start: 2023-02-11 — End: 2023-02-11
  Administered 2023-02-11: 15 ug via INTRATHECAL

## 2023-02-11 MED ORDER — ONDANSETRON HCL 4 MG/2ML IJ SOLN
INTRAMUSCULAR | Status: AC
  Filled 2023-02-11: qty 2

## 2023-02-11 MED ORDER — FAMOTIDINE 20 MG PO TABS
40.0000 mg | ORAL_TABLET | Freq: Once | ORAL | Status: AC
  Administered 2023-02-11: 40 mg via ORAL
  Filled 2023-02-11: qty 2

## 2023-02-11 SURGICAL SUPPLY — 28 items
BLADE SURG 11 STRL SS (BLADE) ×1 IMPLANT
CLIP FILSHIE TUBAL LIGA STRL (Clip) ×1 IMPLANT
CLOTH BEACON ORANGE TIMEOUT ST (SAFETY) ×1 IMPLANT
DRSG OPSITE POSTOP 3X4 (GAUZE/BANDAGES/DRESSINGS) ×1 IMPLANT
DURAPREP 26ML APPLICATOR (WOUND CARE) ×1 IMPLANT
GLOVE BIOGEL PI IND STRL 7.0 (GLOVE) ×3 IMPLANT
GLOVE ECLIPSE 7.0 STRL STRAW (GLOVE) ×1 IMPLANT
GOWN STRL REUS W/TWL LRG LVL3 (GOWN DISPOSABLE) ×2 IMPLANT
LIGASURE IMPACT 36 18CM CVD LR (INSTRUMENTS) IMPLANT
NEEDLE HYPO 22GX1.5 SAFETY (NEEDLE) ×1 IMPLANT
NS IRRIG 1000ML POUR BTL (IV SOLUTION) ×1 IMPLANT
PACK ABDOMINAL MINOR (CUSTOM PROCEDURE TRAY) ×1 IMPLANT
PROTECTOR NERVE ULNAR (MISCELLANEOUS) ×1 IMPLANT
RTRCTR WOUND ALEXIS 18CM SML (INSTRUMENTS) ×1
SAVER CELL AAL HAEMONETICS (INSTRUMENTS) IMPLANT
SPONGE LAP 4X18 RFD (DISPOSABLE) IMPLANT
SPONGE T-LAP 18X18 ~~LOC~~+RFID (SPONGE) IMPLANT
SPONGE T-LAP 4X18 ~~LOC~~+RFID (SPONGE) IMPLANT
SUT MON AB 3-0 SH 27 (SUTURE) ×1
SUT MON AB 3-0 SH27 (SUTURE) IMPLANT
SUT VIC AB 0 CT1 27 (SUTURE) ×1
SUT VIC AB 0 CT1 27XBRD ANBCTR (SUTURE) IMPLANT
SUT VICRYL 0 UR6 27IN ABS (SUTURE) ×1 IMPLANT
SUT VICRYL 4-0 PS2 18IN ABS (SUTURE) ×1 IMPLANT
SYR CONTROL 10ML LL (SYRINGE) ×1 IMPLANT
TOWEL OR 17X24 6PK STRL BLUE (TOWEL DISPOSABLE) ×2 IMPLANT
TRAY FOLEY CATH SILVER 14FR (SET/KITS/TRAYS/PACK) ×1 IMPLANT
WATER STERILE IRR 1000ML POUR (IV SOLUTION) ×1 IMPLANT

## 2023-02-11 NOTE — Progress Notes (Signed)
Postpartum tubal consent:  37 y.o. [id number]  with undesired fertility,status post vaginal delivery, expressed desire for permanent sterilization.  I reviewed with the patient her expressed plan for tubal sterilization.  We dicussed other reversible forms of contraception including LARC options with similar effectiveness of BTS. She expressed that she strongly desire permanent sterilization. She declines all other modalities. We discussed method of sterilization with the preferred method being salpingectomy. Risks of procedure discussed with patient including but not limited to: risk of regret, permanence of method, bleeding, infection, injury to surrounding organs and need for additional procedures.  Failure risk of 0.5-1% with increased risk of ectopic gestation if pregnancy occurs was also discussed with patient.    Federico Flake, MD

## 2023-02-11 NOTE — Anesthesia Postprocedure Evaluation (Signed)
Anesthesia Post Note  Patient: Debra Wyatt  Procedure(s) Performed: POST PARTUM TUBAL LIGATION     Patient location during evaluation: PACU Anesthesia Type: Spinal Level of consciousness: awake and alert Pain management: pain level controlled Vital Signs Assessment: post-procedure vital signs reviewed and stable Respiratory status: spontaneous breathing, nonlabored ventilation and respiratory function stable Cardiovascular status: blood pressure returned to baseline Postop Assessment: no apparent nausea or vomiting, spinal receding, no headache and no backache Anesthetic complications: no   No notable events documented.  Last Vitals:  Vitals:   02/11/23 1315 02/11/23 1342  BP:  118/70  Pulse: 87 71  Resp: (!) 27 (!) 22  Temp:  36.4 C  SpO2: 97% 99%    Last Pain:  Vitals:   02/11/23 1342  TempSrc: Oral  PainSc: 0-No pain   Pain Goal:                Epidural/Spinal Function Cutaneous sensation: Able to Wiggle Toes (02/11/23 1342), Patient able to flex knees: Yes (02/11/23 1342), Patient able to lift hips off bed: Yes (02/11/23 1342), Back pain beyond tenderness at insertion site: No (02/11/23 1342), Progressively worsening motor and/or sensory loss: No (02/11/23 1342), Bowel and/or bladder incontinence post epidural: No (02/11/23 1342)  Shanda Howells

## 2023-02-11 NOTE — Anesthesia Procedure Notes (Addendum)
Spinal  Patient location during procedure: OR Start time: 02/11/2023 11:06 AM End time: 02/11/2023 11:09 AM Reason for block: surgical anesthesia Staffing Performed: anesthesiologist  Anesthesiologist: Kaylyn Layer, MD Performed by: Kaylyn Layer, MD Authorized by: Kaylyn Layer, MD   Preanesthetic Checklist Completed: patient identified, IV checked, risks and benefits discussed, monitors and equipment checked, pre-op evaluation and timeout performed Spinal Block Patient position: sitting Prep: DuraPrep and site prepped and draped Patient monitoring: heart rate, continuous pulse ox and blood pressure Approach: midline Location: L3-4 Injection technique: single-shot Needle Needle type: Pencan  Needle gauge: 24 G Needle length: 10 cm Assessment Sensory level: T4 Events: CSF return Additional Notes Risks, benefits, and alternative discussed. Patient gave consent to procedure. Prepped and draped in sitting position. Clear CSF obtained after one needle pass. Positive terminal aspiration. No pain or paraesthesias with injection. Patient tolerated procedure well. Vital signs stable. Amalia Greenhouse, MD

## 2023-02-11 NOTE — Lactation Note (Signed)
This note was copied from a baby's chart. Lactation Consultation Note  Patient Name: Debra Wyatt [id number] Date: 02/11/2023 Age:35 hours  Mother going for BTL.  Declined assistance at this time and feels comfortable with bottle feeding and pumping with manual pump.  Will call LC if needed.     Feeding Nipple Type: Slow - flow   Hardie Pulley 02/11/2023, 9:50 AM

## 2023-02-11 NOTE — Transfer of Care (Signed)
Immediate Anesthesia Transfer of Care Note  Patient: Debra Wyatt  Procedure(s) Performed: POST PARTUM TUBAL LIGATION  Patient Location: PACU  Anesthesia Type:Spinal  Level of Consciousness: awake  Airway & Oxygen Therapy: Patient Spontanous Breathing  Post-op Assessment: Report given to RN and Post -op Vital signs reviewed and stable  Post vital signs: Reviewed and stable  Last Vitals:  Vitals Value Taken Time  BP 106/57 02/11/23 1226  Temp    Pulse 75 02/11/23 1227  Resp 16 02/11/23 1227  SpO2 98 % 02/11/23 1227  Vitals shown include unfiled device data.  Last Pain:  Vitals:   02/11/23 0922  TempSrc: Oral  PainSc:          Complications: No notable events documented.

## 2023-02-11 NOTE — Progress Notes (Signed)
Post Partum Day 1 Subjective:  Debra Wyatt is a 37 y.o. [id number] [redacted]w[redacted]d s/p SVD with shoulder dystocia.  No acute events overnight.  Pt denies problems with ambulating, voiding or po intake.  She denies nausea or vomiting.  Pain is well controlled.  She has had flatus.  Lochia Minimal.  Plan for birth control is bilateral tubal ligation.  Method of Feeding: both  Objective: Blood pressure 122/65, pulse 71, temperature 98.1 F (36.7 C), temperature source Oral, resp. rate 18, height 5\' 3"  (1.6 m), weight 103.1 kg, last menstrual period 05/06/2022, SpO2 98%, unknown if currently breastfeeding.  Physical Exam:  General: alert, cooperative and no distress Lochia:normal flow Chest: normal WOB Heart: Regular rate Abdomen: +BS, soft, mild TTP (appropriate) Uterine Fundus: firm, At umbilcus DVT Evaluation: No evidence of DVT seen on physical exam. Extremities: no edema  Recent Labs    02/10/23 0903 02/11/23 0527  HGB 12.0 9.9*  HCT 37.0 31.2*    Assessment/Plan:  ASSESSMENT: Debra Wyatt is a 37 y.o. [id number] [redacted]w[redacted]d s/p SVD  # BTS: last ate crackers at about 0300/0330- patient desires BTS today. NPO now and consent. Anesthesia aware  Plan for discharge tomorrow Continue routine PP care Breastfeeding support PRN   LOS: 1 day   Federico Flake 02/11/2023, 9:09 AM

## 2023-02-11 NOTE — Op Note (Signed)
Debra Wyatt 02/11/2023  PREOPERATIVE DIAGNOSIS:  Undesired fertility  POSTOPERATIVE DIAGNOSIS:  Undesired fertility  PROCEDURE:  Postpartum Bilateral Tubal Sterilization using Ligasure   SURGEON: Federico Flake, MD  ASSISTANT: Tinnie Gens MD  An experienced assistant was required given the standard of surgical care given the complexity of the case.  This assistant was needed for exposure, dissection, suctioning, retraction, instrument exchange, and for overall help during the procedure  ANESTHESIA: spinal  COMPLICATIONS:  None immediate.  ESTIMATED BLOOD LOSS:  Less than 20cc.  FLUIDS: 100 mL LR.  URINE OUTPUT:  20 mL of clear urine.  INDICATIONS: 37 y.o. yo [id number]  with undesired fertility,status post vaginal delivery, desires permanent sterilization. Risks and benefits of procedure discussed with patient including permanence of method, bleeding, infection, injury to surrounding organs and need for additional procedures. Risk failure of 0.5-1% with increased risk of ectopic gestation if pregnancy occurs was also discussed with patient.   FINDINGS:  Normal uterus, tubes, and ovaries.  TECHNIQUE:  The patient was taken to the operating room where her epidural anesthesia was dosed up to surgical level and found to be adequate.  She was then placed in the dorsal supine position and prepped and draped in sterile fashion.  After an adequate timeout was performed, attention was turned to the patient's abdomen where a small transverse skin incision was made under the umbilical fold. The incision was taken down to the layer of fascia using the scalpel, and fascia was incised, and extended bilaterally using Mayo scissors. The peritoneum was entered in a sharp fashion.   Attention was then turned to the patient's uterus, and left fallopian tube was identified and followed out to the fimbriated end.  Ligasure device was used to cauterize and cut the mesosalpinx to proximal end of the  fallopian tube, removing ~6cm of tube. A similar process was carried out on the right side allowing for bilateral tubal sterilization.    Good hemostasis was noted overall.  The instruments were then removed from the patient's abdomen and the fascial incision was repaired with 0 Vicryl, and the skin was closed with a 3-0 Monocryl subcuticular stitch. The patient tolerated the procedure well.  Sponge, lap, and needle counts were correct times two.  The patient was then taken to the recovery room awake, extubated and in stable condition.   Federico Flake, MD 02/11/2023 12:28 PM

## 2023-02-11 NOTE — Anesthesia Preprocedure Evaluation (Addendum)
Anesthesia Evaluation  Patient identified by MRN, date of birth, ID band Patient awake    Reviewed: Allergy & Precautions, NPO status , Patient's Chart, lab work & pertinent test results  History of Anesthesia Complications Negative for: history of anesthetic complications  Airway Mallampati: II  TM Distance: >3 FB Neck ROM: Full    Dental no notable dental hx.    Pulmonary neg pulmonary ROS   Pulmonary exam normal        Cardiovascular negative cardio ROS Normal cardiovascular exam     Neuro/Psych  Headaches    GI/Hepatic negative GI ROS, Neg liver ROS,,,  Endo/Other  diabetes, Gestational  Morbid obesity  Renal/GU negative Renal ROS  negative genitourinary   Musculoskeletal negative musculoskeletal ROS (+)    Abdominal   Peds  Hematology  (+) Blood dyscrasia (Hgb 9.9), anemia   Anesthesia Other Findings Day of surgery medications reviewed with patient.  Reproductive/Obstetrics PPD#1                             Anesthesia Physical Anesthesia Plan  ASA: 3  Anesthesia Plan: Spinal   Post-op Pain Management: Ofirmev IV (intra-op)*   Induction:   PONV Risk Score and Plan: 2 and Treatment may vary due to age or medical condition, Ondansetron and Midazolam  Airway Management Planned: Natural Airway  Additional Equipment: None  Intra-op Plan:   Post-operative Plan:   Informed Consent: I have reviewed the patients History and Physical, chart, labs and discussed the procedure including the risks, benefits and alternatives for the proposed anesthesia with the patient or authorized representative who has indicated his/her understanding and acceptance.       Plan Discussed with: CRNA  Anesthesia Plan Comments:        Anesthesia Quick Evaluation

## 2023-02-12 MED ORDER — SENNOSIDES-DOCUSATE SODIUM 8.6-50 MG PO TABS: 2.0000 | ORAL_TABLET | ORAL | 0 refills | Status: AC

## 2023-02-12 MED ORDER — OXYCODONE HCL 5 MG PO TABS: 5.0000 mg | ORAL_TABLET | Freq: Four times a day (QID) | ORAL | 0 refills | Status: AC | PRN

## 2023-02-12 MED ORDER — IBUPROFEN 600 MG PO TABS: 600.0000 mg | ORAL_TABLET | Freq: Four times a day (QID) | ORAL | 0 refills | Status: AC

## 2023-02-12 NOTE — Lactation Note (Signed)
This note was copied from a baby's chart. Lactation Consultation Note  Patient Name: Girl Myia Bergh [id number] Date: 02/12/2023 Age:35 hours Reason for consult: Follow-up assessment;Infant weight loss;Exclusive pumping and bottle feeding;Term;Maternal endocrine disorder (2 % , baby has a fx arm) LC reviewed supply and demand , importance of pumping both breast 8-10 times day with the DEBP for 15 -20 mins. Mom has the storage guidelines.  LC reviewed engorgement prevention and tx, and LC resources.   Maternal Data    Feeding Mother's Current Feeding Choice: Breast Milk and Formula Nipple Type: Slow - flow    Lactation Tools Discussed/Used Tools: Pump;Flanges Flange Size: 24;21 Breast pump type: Manual Pump Education: Milk Storage  Interventions Interventions: Breast feeding basics reviewed;Hand pump;Education;LC Services brochure  Discharge Discharge Education: Engorgement and breast care;Warning signs for feeding baby Pump: Personal;DEBP;Manual  Consult Status Consult Status: Complete Date: 02/12/23    Kathrin Greathouse 02/12/2023, 11:35 AM

## 2023-02-15 LAB — SURGICAL PATHOLOGY

## 2023-02-17 ENCOUNTER — Inpatient Hospital Stay (HOSPITAL_COMMUNITY): Payer: Medicaid Other

## 2023-02-17 ENCOUNTER — Inpatient Hospital Stay (HOSPITAL_COMMUNITY)
Admission: RE | Admit: 2023-02-17 | Payer: Medicaid Other | Source: Home / Self Care | Admitting: Obstetrics and Gynecology

## 2023-02-28 ENCOUNTER — Other Ambulatory Visit: Payer: Self-pay

## 2023-02-28 DIAGNOSIS — Z0000 Encounter for general adult medical examination without abnormal findings: Secondary | ICD-10-CM

## 2023-02-28 MED ORDER — DOCUSATE SODIUM 100 MG PO CAPS: 100.0000 mg | ORAL_CAPSULE | Freq: Two times a day (BID) | ORAL | 2 refills | Status: AC | PRN

## 2023-02-28 MED ORDER — VITAFOL ULTRA 29-0.6-0.4-200 MG PO CAPS: 1.0000 | ORAL_CAPSULE | Freq: Every day | ORAL | 0 refills | Status: DC

## 2023-03-08 ENCOUNTER — Telehealth (HOSPITAL_COMMUNITY): Payer: Self-pay | Admitting: *Deleted

## 2023-03-08 NOTE — Telephone Encounter (Signed)
03/08/2023  Name: Debra Wyatt MRN: [id number] DOB: 07-16-1985  Reason for Call:  Transition of Care Hospital Discharge Call  Contact Status: Patient Contact Status: Message  Language assistant needed:          Follow-Up Questions:    Inocente Salles Postnatal Depression Scale:  In the Past 7 Days:    PHQ2-9 Depression Scale:     Discharge Follow-up:    Post-discharge interventions: NA  Salena Saner, RN 03/08/2023 15:23

## 2023-03-16 ENCOUNTER — Ambulatory Visit: Payer: Medicaid Other | Admitting: Obstetrics and Gynecology

## 2023-03-16 DIAGNOSIS — K649 Unspecified hemorrhoids: Secondary | ICD-10-CM | POA: Diagnosis not present

## 2023-03-16 NOTE — Progress Notes (Signed)
..   Post Partum Visit Note  Debra Wyatt is a 37 y.o. [id number] female who presents for a postpartum visit. She is 5 weeks postpartum following a normal spontaneous vaginal delivery.  I have fully reviewed the prenatal and intrapartum course. The delivery was at 40.0 gestational weeks.  Anesthesia: none. Postpartum course has been good. Baby is doing well. Baby is feeding by both breast and bottle - Similac Advance. Bleeding staining only. Bowel function is normal. Bladder function is normal. Patient is not sexually active. Contraception method is tubal ligation. Postpartum depression screening: negative.   The pregnancy intention screening data noted above was reviewed. Potential methods of contraception were discussed. The patient elected to proceed with BTL.   Edinburgh Postnatal Depression Scale - 03/16/23 1115       Edinburgh Postnatal Depression Scale:  In the Past 7 Days   I have been able to laugh and see the funny side of things. 0    I have looked forward with enjoyment to things. 0    I have blamed myself unnecessarily when things went wrong. 0    I have been anxious or worried for no good reason. 0    I have felt scared or panicky for no good reason. 0    Things have been getting on top of me. 0    I have been so unhappy that I have had difficulty sleeping. 0    I have felt sad or miserable. 0    I have been so unhappy that I have been crying. 0    The thought of harming myself has occurred to me. 0    Edinburgh Postnatal Depression Scale Total 0             Health Maintenance Due  Topic Date Due   FOOT EXAM  Never done   OPHTHALMOLOGY EXAM  Never done   Diabetic kidney evaluation - eGFR measurement  Never done   Diabetic kidney evaluation - Urine ACR  Never done   INFLUENZA VACCINE  12/09/2022   COVID-19 Vaccine (1 - 2023-24 season) Never done    The following portions of the patient's history were reviewed and updated as appropriate: allergies, current  medications, past family history, past medical history, past social history, past surgical history, and problem list.  Review of Systems Pertinent items are noted in HPI.  Objective:  BP 133/87   Pulse 64   Wt 203 lb 8 oz (92.3 kg)   LMP 05/06/2022   Breastfeeding Yes   BMI 36.05 kg/m    General:  alert, cooperative, and no distress   Breasts:  not indicated  Lungs: clear to auscultation bilaterally  Heart:  regular rate and rhythm  Abdomen: soft, non-tender; bowel sounds normal; no masses,  no organomegaly   Wound well approximated incision, at the umbilicus  GU exam:  not indicated       Assessment:   Encounter for postpartum care  normal postpartum exam.   Plan:   Essential components of care per ACOG recommendations:  1.  Mood and well being: Patient with negative depression screening today. Reviewed local resources for support.  - Patient tobacco use? No.   - hx of drug use? No.    2. Infant care and feeding:  -Patient currently breastmilk feeding? Yes. Reviewed importance of draining breast regularly to support lactation.  -Social determinants of health (SDOH) reviewed in EPIC. No concerns.  3. Sexuality, contraception and birth spacing - Patient does not want a  pregnancy in the next year.  Desired family size is 4 children.  - Reviewed reproductive life planning. Reviewed contraceptive methods based on pt preferences and effectiveness.  Patient desired Female Sterilization today.   - Discussed birth spacing of 18 months  4. Sleep and fatigue -Encouraged family/partner/community support of 4 hrs of uninterrupted sleep to help with mood and fatigue  5. Physical Recovery  - Discussed patients delivery and complications. She describes her labor as mixed. - Patient had a  vaginal delivery with shoulder dystocia . Patient had no laceration. Perineal healing reviewed. Patient expressed understanding - Patient has urinary incontinence? No. - Patient is safe to  resume physical and sexual activity  6.  Health Maintenance - HM due items addressed Yes - Last pap smear  Diagnosis  Date Value Ref Range Status  08/17/2022   Final   - Negative for intraepithelial lesion or malignancy (NILM)   Pap smear not done at today's visit.  -Breast Cancer screening indicated? No.   7. Chronic Disease/Pregnancy Condition follow up: None  Pt still c/o hemorrhoids and desires surgical evaluation/management Referral sent  Warden Fillers, MD [facility], [facility]

## 2023-05-24 ENCOUNTER — Other Ambulatory Visit: Payer: Self-pay

## 2023-05-24 DIAGNOSIS — Z0000 Encounter for general adult medical examination without abnormal findings: Secondary | ICD-10-CM

## 2023-05-24 MED ORDER — VITAFOL ULTRA 29-0.6-0.4-200 MG PO CAPS: 1.0000 | ORAL_CAPSULE | Freq: Every day | ORAL | 0 refills | Status: DC

## 2023-05-24 NOTE — Progress Notes (Signed)
 Sent PNV in for refill. TC to pt.

## 2023-08-04 ENCOUNTER — Other Ambulatory Visit: Payer: Self-pay

## 2023-08-04 DIAGNOSIS — Z0000 Encounter for general adult medical examination without abnormal findings: Secondary | ICD-10-CM

## 2023-08-04 MED ORDER — VITAFOL ULTRA 29-0.6-0.4-200 MG PO CAPS: 1.0000 | ORAL_CAPSULE | Freq: Every day | ORAL | 1 refills | Status: AC

## 2023-08-04 NOTE — Progress Notes (Signed)
 PNV refill sent due to pt postpartum and breastfeeding

## 2023-08-25 ENCOUNTER — Telehealth: Payer: Self-pay | Admitting: Obstetrics and Gynecology

## 2023-08-25 NOTE — Telephone Encounter (Signed)
 New message    1. Which medications need to be refilled? (please list name of each medication and dose if known) Prenat-Fe Poly-Methfol-FA-DHA (VITAFOL ULTRA) 29-0.6-0.4-200 MG CAPS [[id number]]    2. Which pharmacy/location (including street and city if local pharmacy) is medication to be sent to? [facility] on [address]    5. Do they need a 30 day or 90 day supply? 90 days supply

## 2024-05-22 ENCOUNTER — Other Ambulatory Visit (HOSPITAL_COMMUNITY): Payer: Self-pay
